# Patient Record
Sex: Female | Born: 1960 | Race: White | Hispanic: No | Marital: Single | Smoking: Never smoker
Health system: Southern US, Community
[De-identification: ages and names within clinical notes are randomized; demographics above are authoritative.]

## PROBLEM LIST (undated history)

## (undated) DIAGNOSIS — I2510 Atherosclerotic heart disease of native coronary artery without angina pectoris: Secondary | ICD-10-CM

## (undated) DIAGNOSIS — K5900 Constipation, unspecified: Secondary | ICD-10-CM

## (undated) DIAGNOSIS — I10 Essential (primary) hypertension: Secondary | ICD-10-CM

## (undated) DIAGNOSIS — F209 Schizophrenia, unspecified: Secondary | ICD-10-CM

## (undated) DIAGNOSIS — E669 Obesity, unspecified: Secondary | ICD-10-CM

## (undated) DIAGNOSIS — R569 Unspecified convulsions: Secondary | ICD-10-CM

## (undated) DIAGNOSIS — F319 Bipolar disorder, unspecified: Secondary | ICD-10-CM

## (undated) DIAGNOSIS — I509 Heart failure, unspecified: Secondary | ICD-10-CM

## (undated) DIAGNOSIS — F0390 Unspecified dementia without behavioral disturbance: Secondary | ICD-10-CM

## (undated) DIAGNOSIS — E119 Type 2 diabetes mellitus without complications: Secondary | ICD-10-CM

---

## 2005-02-04 ENCOUNTER — Ambulatory Visit: Payer: Self-pay

## 2006-07-08 ENCOUNTER — Ambulatory Visit: Payer: Self-pay | Admitting: Family Medicine

## 2007-08-05 ENCOUNTER — Ambulatory Visit: Payer: Self-pay | Admitting: Family Medicine

## 2007-12-10 ENCOUNTER — Ambulatory Visit: Payer: Self-pay | Admitting: Family Medicine

## 2008-05-16 ENCOUNTER — Encounter: Payer: Self-pay | Admitting: Family Medicine

## 2008-05-18 ENCOUNTER — Other Ambulatory Visit: Payer: Self-pay

## 2008-05-18 ENCOUNTER — Emergency Department: Payer: Self-pay | Admitting: Emergency Medicine

## 2008-06-07 ENCOUNTER — Encounter: Payer: Self-pay | Admitting: Family Medicine

## 2008-08-07 ENCOUNTER — Ambulatory Visit: Payer: Self-pay | Admitting: Family Medicine

## 2008-11-27 ENCOUNTER — Emergency Department: Payer: Self-pay | Admitting: Emergency Medicine

## 2008-12-05 ENCOUNTER — Emergency Department: Payer: Self-pay | Admitting: Emergency Medicine

## 2009-04-27 ENCOUNTER — Ambulatory Visit: Payer: Self-pay | Admitting: Family Medicine

## 2009-06-18 ENCOUNTER — Ambulatory Visit: Payer: Self-pay | Admitting: Family Medicine

## 2009-07-03 ENCOUNTER — Encounter: Payer: Self-pay | Admitting: Family Medicine

## 2009-07-08 ENCOUNTER — Encounter: Payer: Self-pay | Admitting: Family Medicine

## 2009-08-08 ENCOUNTER — Encounter: Payer: Self-pay | Admitting: Family Medicine

## 2009-08-14 ENCOUNTER — Ambulatory Visit: Payer: Self-pay | Admitting: Family Medicine

## 2009-08-30 ENCOUNTER — Emergency Department: Payer: Self-pay | Admitting: Emergency Medicine

## 2009-09-14 ENCOUNTER — Encounter: Payer: Self-pay | Admitting: Family Medicine

## 2009-10-08 ENCOUNTER — Encounter: Payer: Self-pay | Admitting: Family Medicine

## 2010-10-22 ENCOUNTER — Ambulatory Visit: Payer: Self-pay | Admitting: Family Medicine

## 2011-10-25 ENCOUNTER — Emergency Department: Payer: Self-pay | Admitting: Emergency Medicine

## 2011-12-06 ENCOUNTER — Emergency Department: Payer: Self-pay | Admitting: Emergency Medicine

## 2012-03-17 ENCOUNTER — Emergency Department: Payer: Self-pay | Admitting: Emergency Medicine

## 2013-07-27 ENCOUNTER — Emergency Department: Payer: Self-pay | Admitting: Internal Medicine

## 2013-07-27 LAB — CBC WITH DIFFERENTIAL/PLATELET
Basophil #: 0.1 10*3/uL (ref 0.0–0.1)
Basophil %: 1.1 %
Eosinophil #: 0.1 10*3/uL (ref 0.0–0.7)
Eosinophil %: 1.3 %
MCHC: 34.2 g/dL (ref 32.0–36.0)
MCV: 96 fL (ref 80–100)
Neutrophil #: 5.7 10*3/uL (ref 1.4–6.5)
RBC: 4.1 10*6/uL (ref 3.80–5.20)
WBC: 8.5 10*3/uL (ref 3.6–11.0)

## 2013-07-27 LAB — COMPREHENSIVE METABOLIC PANEL
Alkaline Phosphatase: 79 U/L (ref 50–136)
BUN: 20 mg/dL — ABNORMAL HIGH (ref 7–18)
Chloride: 104 mmol/L (ref 98–107)
Co2: 21 mmol/L (ref 21–32)
Creatinine: 1.08 mg/dL (ref 0.60–1.30)
EGFR (African American): >60
Glucose: 256 mg/dL — ABNORMAL HIGH (ref 65–99)
Osmolality: 285 (ref 275–301)
Potassium: 4.8 mmol/L (ref 3.5–5.1)
SGOT(AST): 19 U/L (ref 15–37)
SGPT (ALT): 27 U/L (ref 12–78)
Total Protein: 6.4 g/dL (ref 6.4–8.2)

## 2013-07-27 LAB — TROPONIN I: Troponin-I: <0.02 ng/mL

## 2013-08-08 ENCOUNTER — Ambulatory Visit: Payer: Self-pay | Admitting: Internal Medicine

## 2013-08-22 ENCOUNTER — Ambulatory Visit: Payer: Self-pay | Admitting: Family Medicine

## 2013-08-26 ENCOUNTER — Inpatient Hospital Stay: Payer: Self-pay | Admitting: Internal Medicine

## 2013-08-26 LAB — URINALYSIS, COMPLETE
Blood: NEGATIVE
Nitrite: NEGATIVE
Specific Gravity: 1.03 (ref 1.003–1.030)
Squamous Epithelial: 14

## 2013-08-26 LAB — DIFFERENTIAL
Basophil %: 0.6 %
Lymphocyte #: 1.6 10*3/uL (ref 1.0–3.6)
Lymphocyte %: 15.3 %
Monocyte #: 0.9 x10 3/mm (ref 0.2–0.9)
Neutrophil #: 7.7 10*3/uL — ABNORMAL HIGH (ref 1.4–6.5)
Neutrophil %: 74.6 %

## 2013-08-26 LAB — COMPREHENSIVE METABOLIC PANEL
Albumin: 2.7 g/dL — ABNORMAL LOW (ref 3.4–5.0)
Alkaline Phosphatase: 83 U/L (ref 50–136)
Bilirubin,Total: 0.3 mg/dL (ref 0.2–1.0)
Calcium, Total: 8.9 mg/dL (ref 8.5–10.1)
Chloride: 104 mmol/L (ref 98–107)
Co2: 25 mmol/L (ref 21–32)
Creatinine: 0.67 mg/dL (ref 0.60–1.30)
EGFR (African American): >60
Glucose: 221 mg/dL — ABNORMAL HIGH (ref 65–99)
Osmolality: 279 (ref 275–301)
SGPT (ALT): 14 U/L (ref 12–78)

## 2013-08-26 LAB — CBC
HCT: 34.9 % — ABNORMAL LOW (ref 35.0–47.0)
HGB: 11.9 g/dL — ABNORMAL LOW (ref 12.0–16.0)
RBC: 3.66 10*6/uL — ABNORMAL LOW (ref 3.80–5.20)
RDW: 15 % — ABNORMAL HIGH (ref 11.5–14.5)
WBC: 10.3 10*3/uL (ref 3.6–11.0)

## 2013-08-26 LAB — TROPONIN I: Troponin-I: <0.02 ng/mL

## 2013-08-28 LAB — COMPREHENSIVE METABOLIC PANEL
Albumin: 2.2 g/dL — ABNORMAL LOW (ref 3.4–5.0)
Anion Gap: 7 (ref 7–16)
BUN: 12 mg/dL (ref 7–18)
Bilirubin,Total: 0.3 mg/dL (ref 0.2–1.0)
Calcium, Total: 8.8 mg/dL (ref 8.5–10.1)
Chloride: 103 mmol/L (ref 98–107)
Co2: 28 mmol/L (ref 21–32)
Creatinine: 0.8 mg/dL (ref 0.60–1.30)
EGFR (African American): >60
EGFR (Non-African Amer.): >60
Glucose: 192 mg/dL — ABNORMAL HIGH (ref 65–99)
Potassium: 4.2 mmol/L (ref 3.5–5.1)
SGOT(AST): 9 U/L — ABNORMAL LOW (ref 15–37)
Sodium: 138 mmol/L (ref 136–145)
Total Protein: 6.5 g/dL (ref 6.4–8.2)

## 2013-08-28 LAB — CBC WITH DIFFERENTIAL/PLATELET
Basophil #: 0 10*3/uL (ref 0.0–0.1)
Eosinophil %: 0.8 %
HGB: 10.7 g/dL — ABNORMAL LOW (ref 12.0–16.0)
Lymphocyte #: 1.2 10*3/uL (ref 1.0–3.6)
MCHC: 33.8 g/dL (ref 32.0–36.0)
MCV: 95 fL (ref 80–100)
Monocyte #: 0.8 x10 3/mm (ref 0.2–0.9)
Neutrophil #: 7 10*3/uL — ABNORMAL HIGH (ref 1.4–6.5)
Neutrophil %: 77.2 %
Platelet: 286 10*3/uL (ref 150–440)
RDW: 14.8 % — ABNORMAL HIGH (ref 11.5–14.5)
WBC: 9 10*3/uL (ref 3.6–11.0)

## 2013-08-28 LAB — PRO B NATRIURETIC PEPTIDE: B-Type Natriuretic Peptide: 68 pg/mL (ref 0–125)

## 2013-08-29 LAB — BASIC METABOLIC PANEL
BUN: 11 mg/dL (ref 7–18)
Co2: 31 mmol/L (ref 21–32)
Creatinine: 0.67 mg/dL (ref 0.60–1.30)
EGFR (African American): >60
EGFR (Non-African Amer.): >60
Glucose: 102 mg/dL — ABNORMAL HIGH (ref 65–99)
Osmolality: 279 (ref 275–301)
Potassium: 4 mmol/L (ref 3.5–5.1)
Sodium: 140 mmol/L (ref 136–145)

## 2013-08-29 LAB — TROPONIN I: Troponin-I: <0.02 ng/mL

## 2013-08-30 LAB — BASIC METABOLIC PANEL
Anion Gap: 7 (ref 7–16)
BUN: 12 mg/dL (ref 7–18)
Calcium, Total: 8.8 mg/dL (ref 8.5–10.1)
Chloride: 97 mmol/L — ABNORMAL LOW (ref 98–107)
Creatinine: 0.69 mg/dL (ref 0.60–1.30)
Osmolality: 277 (ref 275–301)
Sodium: 133 mmol/L — ABNORMAL LOW (ref 136–145)

## 2013-08-31 LAB — CULTURE, BLOOD (SINGLE)

## 2013-08-31 LAB — VANCOMYCIN, TROUGH: Vancomycin, Trough: 10 ug/mL (ref 10–20)

## 2013-09-02 LAB — DIFFERENTIAL
Basophil %: 0.2 %
Eosinophil #: 0.1 10*3/uL (ref 0.0–0.7)
Eosinophil %: 0.6 %
Lymphocyte #: 1.6 10*3/uL (ref 1.0–3.6)
Monocyte %: 6.3 %
Neutrophil #: 16 10*3/uL — ABNORMAL HIGH (ref 1.4–6.5)
Neutrophil %: 84.2 %

## 2013-09-02 LAB — CREATININE, SERUM: EGFR (African American): >60

## 2013-09-03 LAB — CBC WITH DIFFERENTIAL/PLATELET
Basophil #: 0.1 10*3/uL (ref 0.0–0.1)
Eosinophil %: 0 %
HCT: 28.7 % — ABNORMAL LOW (ref 35.0–47.0)
HGB: 9.6 g/dL — ABNORMAL LOW (ref 12.0–16.0)
Lymphocyte #: 1.3 10*3/uL (ref 1.0–3.6)
Lymphocyte %: 7.1 %
MCH: 31.4 pg (ref 26.0–34.0)
MCV: 94 fL (ref 80–100)
Monocyte #: 0.2 x10 3/mm (ref 0.2–0.9)
Monocyte %: 1.2 %
Neutrophil %: 91.1 %
RDW: 15 % — ABNORMAL HIGH (ref 11.5–14.5)

## 2013-09-03 LAB — VANCOMYCIN, TROUGH: Vancomycin, Trough: 24 ug/mL (ref 10–20)

## 2013-09-03 LAB — BASIC METABOLIC PANEL
Anion Gap: 9 (ref 7–16)
BUN: 24 mg/dL — ABNORMAL HIGH (ref 7–18)
Chloride: 95 mmol/L — ABNORMAL LOW (ref 98–107)
Co2: 27 mmol/L (ref 21–32)
Creatinine: 1.43 mg/dL — ABNORMAL HIGH (ref 0.60–1.30)
Osmolality: 279 (ref 275–301)

## 2013-09-03 LAB — PHOSPHORUS: Phosphorus: 4.5 mg/dL (ref 2.5–4.9)

## 2013-09-04 LAB — BASIC METABOLIC PANEL
Anion Gap: 7 (ref 7–16)
BUN: 28 mg/dL — ABNORMAL HIGH (ref 7–18)
Calcium, Total: 8.7 mg/dL (ref 8.5–10.1)
Chloride: 95 mmol/L — ABNORMAL LOW (ref 98–107)
Co2: 31 mmol/L (ref 21–32)
Creatinine: 1.2 mg/dL (ref 0.60–1.30)
EGFR (African American): >60
Glucose: 302 mg/dL — ABNORMAL HIGH (ref 65–99)
Osmolality: 283 (ref 275–301)
Potassium: 3.7 mmol/L (ref 3.5–5.1)

## 2013-09-04 LAB — PHOSPHORUS: Phosphorus: 3.8 mg/dL (ref 2.5–4.9)

## 2013-09-04 LAB — BRONCHIAL WASH CULTURE

## 2013-09-04 LAB — VANCOMYCIN, RANDOM: Vancomycin, Random: 15 ug/mL

## 2013-09-04 LAB — MAGNESIUM: Magnesium: 2 mg/dL

## 2013-09-05 LAB — CBC WITH DIFFERENTIAL/PLATELET
Basophil %: 0.1 %
Eosinophil #: 0 10*3/uL (ref 0.0–0.7)
Eosinophil %: 0 %
HCT: 33.3 % — ABNORMAL LOW (ref 35.0–47.0)
Lymphocyte #: 1.4 10*3/uL (ref 1.0–3.6)
Lymphocyte %: 6.4 %
MCH: 31.4 pg (ref 26.0–34.0)
MCHC: 34 g/dL (ref 32.0–36.0)
MCV: 93 fL (ref 80–100)
Monocyte %: 2.9 %
Neutrophil #: 19.4 10*3/uL — ABNORMAL HIGH (ref 1.4–6.5)
Neutrophil %: 90.6 %
Platelet: 374 10*3/uL (ref 150–440)
RDW: 14.8 % — ABNORMAL HIGH (ref 11.5–14.5)
WBC: 21.4 10*3/uL — ABNORMAL HIGH (ref 3.6–11.0)

## 2013-09-05 LAB — BASIC METABOLIC PANEL
Anion Gap: 8 (ref 7–16)
Chloride: 98 mmol/L (ref 98–107)
EGFR (African American): 58 — ABNORMAL LOW
Sodium: 137 mmol/L (ref 136–145)

## 2013-09-05 LAB — CLOSTRIDIUM DIFFICILE BY PCR

## 2013-09-05 LAB — VANCOMYCIN, TROUGH: Vancomycin, Trough: 14 ug/mL (ref 10–20)

## 2013-09-06 LAB — CBC WITH DIFFERENTIAL/PLATELET
Basophil %: 0.2 %
Eosinophil #: 0 10*3/uL (ref 0.0–0.7)
HCT: 32.5 % — ABNORMAL LOW (ref 35.0–47.0)
HGB: 10.9 g/dL — ABNORMAL LOW (ref 12.0–16.0)
Lymphocyte #: 1.9 10*3/uL (ref 1.0–3.6)
Lymphocyte %: 9.4 %
MCHC: 33.7 g/dL (ref 32.0–36.0)
MCV: 94 fL (ref 80–100)
Monocyte #: 0.9 x10 3/mm (ref 0.2–0.9)
Monocyte %: 4.2 %
Neutrophil #: 17.6 10*3/uL — ABNORMAL HIGH (ref 1.4–6.5)
Platelet: 381 10*3/uL (ref 150–440)
RDW: 14.8 % — ABNORMAL HIGH (ref 11.5–14.5)

## 2013-09-06 LAB — BASIC METABOLIC PANEL
Anion Gap: 6 — ABNORMAL LOW (ref 7–16)
BUN: 37 mg/dL — ABNORMAL HIGH (ref 7–18)
Calcium, Total: 8.7 mg/dL (ref 8.5–10.1)
Chloride: 101 mmol/L (ref 98–107)
Co2: 32 mmol/L (ref 21–32)
Creatinine: 1.22 mg/dL (ref 0.60–1.30)
EGFR (African American): 59 — ABNORMAL LOW
EGFR (Non-African Amer.): 51 — ABNORMAL LOW
Osmolality: 290 (ref 275–301)
Potassium: 4.2 mmol/L (ref 3.5–5.1)

## 2013-09-07 ENCOUNTER — Ambulatory Visit: Payer: Self-pay | Admitting: Internal Medicine

## 2013-09-07 LAB — MAGNESIUM: Magnesium: 2 mg/dL

## 2013-09-07 LAB — BASIC METABOLIC PANEL
BUN: 40 mg/dL — ABNORMAL HIGH (ref 7–18)
Calcium, Total: 8.7 mg/dL (ref 8.5–10.1)
Co2: 34 mmol/L — ABNORMAL HIGH (ref 21–32)
Creatinine: 1.13 mg/dL (ref 0.60–1.30)
EGFR (African American): >60
EGFR (Non-African Amer.): 56 — ABNORMAL LOW
Glucose: 159 mg/dL — ABNORMAL HIGH (ref 65–99)
Osmolality: 296 (ref 275–301)
Sodium: 142 mmol/L (ref 136–145)

## 2013-09-07 LAB — PHOSPHORUS: Phosphorus: 5.5 mg/dL — ABNORMAL HIGH (ref 2.5–4.9)

## 2013-09-07 LAB — VANCOMYCIN, TROUGH: Vancomycin, Trough: 14 ug/mL (ref 10–20)

## 2013-09-08 LAB — MAGNESIUM: Magnesium: 1.5 mg/dL — ABNORMAL LOW

## 2013-09-08 LAB — BASIC METABOLIC PANEL
EGFR (African American): 54 — ABNORMAL LOW
EGFR (Non-African Amer.): 47 — ABNORMAL LOW
Glucose: 323 mg/dL — ABNORMAL HIGH (ref 65–99)
Osmolality: 305 (ref 275–301)
Sodium: 142 mmol/L (ref 136–145)

## 2013-09-08 LAB — PHOSPHORUS: Phosphorus: 3.4 mg/dL (ref 2.5–4.9)

## 2013-09-09 LAB — BASIC METABOLIC PANEL
BUN: 25 mg/dL — ABNORMAL HIGH (ref 7–18)
Calcium, Total: 8.8 mg/dL (ref 8.5–10.1)
Co2: 35 mmol/L — ABNORMAL HIGH (ref 21–32)
Creatinine: 1.01 mg/dL (ref 0.60–1.30)
EGFR (Non-African Amer.): >60
Glucose: 158 mg/dL — ABNORMAL HIGH (ref 65–99)
Osmolality: 300 (ref 275–301)
Potassium: 3.3 mmol/L — ABNORMAL LOW (ref 3.5–5.1)
Sodium: 147 mmol/L — ABNORMAL HIGH (ref 136–145)

## 2013-09-09 LAB — CBC WITH DIFFERENTIAL/PLATELET
Basophil #: 0.1 10*3/uL (ref 0.0–0.1)
Basophil %: 0.4 %
Eosinophil #: 0.1 10*3/uL (ref 0.0–0.7)
HCT: 29.4 % — ABNORMAL LOW (ref 35.0–47.0)
HGB: 9.8 g/dL — ABNORMAL LOW (ref 12.0–16.0)
Lymphocyte %: 17 %
MCH: 31.2 pg (ref 26.0–34.0)
MCV: 94 fL (ref 80–100)
Monocyte %: 7.6 %
Platelet: 333 10*3/uL (ref 150–440)
RDW: 14.9 % — ABNORMAL HIGH (ref 11.5–14.5)
WBC: 14.4 10*3/uL — ABNORMAL HIGH (ref 3.6–11.0)

## 2013-09-09 LAB — PHOSPHORUS: Phosphorus: 3 mg/dL (ref 2.5–4.9)

## 2013-09-09 LAB — EXPECTORATED SPUTUM ASSESSMENT W GRAM STAIN, RFLX TO RESP C

## 2013-09-09 LAB — VALPROIC ACID LEVEL: Valproic Acid: 10 ug/mL — ABNORMAL LOW

## 2013-09-09 LAB — POTASSIUM: Potassium: 4.3 mmol/L (ref 3.5–5.1)

## 2013-09-10 LAB — BASIC METABOLIC PANEL
Anion Gap: 5 — ABNORMAL LOW (ref 7–16)
BUN: 17 mg/dL (ref 7–18)
Chloride: 113 mmol/L — ABNORMAL HIGH (ref 98–107)
EGFR (Non-African Amer.): >60
Glucose: 166 mg/dL — ABNORMAL HIGH (ref 65–99)
Osmolality: 298 (ref 275–301)

## 2013-09-11 LAB — CBC WITH DIFFERENTIAL/PLATELET
Basophil #: 0 10*3/uL (ref 0.0–0.1)
Basophil %: 0.2 %
Eosinophil #: 0.3 10*3/uL (ref 0.0–0.7)
Eosinophil %: 2.3 %
MCH: 32.4 pg (ref 26.0–34.0)
MCHC: 34.4 g/dL (ref 32.0–36.0)
MCV: 94 fL (ref 80–100)
Monocyte #: 0.9 x10 3/mm (ref 0.2–0.9)
Monocyte %: 6.5 %
Neutrophil #: 10.5 10*3/uL — ABNORMAL HIGH (ref 1.4–6.5)
Neutrophil %: 78.2 %
Platelet: 324 10*3/uL (ref 150–440)
RDW: 15 % — ABNORMAL HIGH (ref 11.5–14.5)
WBC: 13.5 10*3/uL — ABNORMAL HIGH (ref 3.6–11.0)

## 2013-09-12 LAB — VALPROIC ACID LEVEL: Valproic Acid: 42 ug/mL — ABNORMAL LOW

## 2013-09-13 LAB — DIFFERENTIAL
Basophil #: 0 10*3/uL (ref 0.0–0.1)
Eosinophil %: 3 %
Lymphocyte #: 2 10*3/uL (ref 1.0–3.6)
Neutrophil #: 6.6 10*3/uL — ABNORMAL HIGH (ref 1.4–6.5)
Neutrophil %: 68.9 %

## 2013-09-13 LAB — WBC: WBC: 9.6 10*3/uL (ref 3.6–11.0)

## 2013-09-28 ENCOUNTER — Emergency Department: Payer: Self-pay | Admitting: Emergency Medicine

## 2013-09-28 LAB — URINALYSIS, COMPLETE
Bilirubin,UR: NEGATIVE
Glucose,UR: NEGATIVE mg/dL (ref 0–75)
Hyaline Cast: 22
Nitrite: NEGATIVE
Ph: 5 (ref 4.5–8.0)
Protein: 100
Specific Gravity: 1.015 (ref 1.003–1.030)
Squamous Epithelial: NONE SEEN
WBC UR: 2160 /HPF (ref 0–5)

## 2013-09-28 LAB — CBC WITH DIFFERENTIAL/PLATELET
Basophil %: 0.7 %
HCT: 34 % — ABNORMAL LOW (ref 35.0–47.0)
HGB: 11.4 g/dL — ABNORMAL LOW (ref 12.0–16.0)
MCV: 97 fL (ref 80–100)
Monocyte #: 0.5 x10 3/mm (ref 0.2–0.9)
Monocyte %: 7.1 %
Neutrophil #: 4 10*3/uL (ref 1.4–6.5)
Platelet: 502 10*3/uL — ABNORMAL HIGH (ref 150–440)
RBC: 3.52 10*6/uL — ABNORMAL LOW (ref 3.80–5.20)
WBC: 6.9 10*3/uL (ref 3.6–11.0)

## 2013-09-28 LAB — COMPREHENSIVE METABOLIC PANEL
Albumin: 3.1 g/dL — ABNORMAL LOW (ref 3.4–5.0)
Anion Gap: 16 (ref 7–16)
BUN: 14 mg/dL (ref 7–18)
Bilirubin,Total: 0.2 mg/dL (ref 0.2–1.0)
Calcium, Total: 9.1 mg/dL (ref 8.5–10.1)
Chloride: 102 mmol/L (ref 98–107)
Co2: 18 mmol/L — ABNORMAL LOW (ref 21–32)
EGFR (African American): >60
Glucose: 149 mg/dL — ABNORMAL HIGH (ref 65–99)
Osmolality: 275 (ref 275–301)
Potassium: 4.5 mmol/L (ref 3.5–5.1)
Sodium: 136 mmol/L (ref 136–145)
Total Protein: 6.7 g/dL (ref 6.4–8.2)

## 2013-09-28 LAB — DRUG SCREEN, URINE
Benzodiazepine, Ur Scrn: NEGATIVE (ref ?–200)
Cocaine Metabolite,Ur ~~LOC~~: NEGATIVE (ref ?–300)
MDMA (Ecstasy)Ur Screen: NEGATIVE (ref ?–500)
Methadone, Ur Screen: NEGATIVE (ref ?–300)
Phencyclidine (PCP) Ur S: NEGATIVE (ref ?–25)
Tricyclic, Ur Screen: NEGATIVE (ref ?–1000)

## 2013-10-08 ENCOUNTER — Ambulatory Visit: Payer: Self-pay | Admitting: Nurse Practitioner

## 2014-06-28 ENCOUNTER — Ambulatory Visit: Payer: Self-pay | Admitting: Obstetrics & Gynecology

## 2014-06-28 LAB — CBC
HCT: 35.4 % (ref 35.0–47.0)
HGB: 11.3 g/dL — ABNORMAL LOW (ref 12.0–16.0)
MCH: 30.3 pg (ref 26.0–34.0)
MCHC: 31.8 g/dL — ABNORMAL LOW (ref 32.0–36.0)
MCV: 95 fL (ref 80–100)
Platelet: 308 10*3/uL (ref 150–440)
RBC: 3.72 10*6/uL — ABNORMAL LOW (ref 3.80–5.20)
RDW: 14.9 % — ABNORMAL HIGH (ref 11.5–14.5)
WBC: 8.4 10*3/uL (ref 3.6–11.0)

## 2014-06-28 LAB — BASIC METABOLIC PANEL
ANION GAP: 8 (ref 7–16)
BUN: 15 mg/dL (ref 7–18)
CALCIUM: 8.7 mg/dL (ref 8.5–10.1)
Chloride: 105 mmol/L (ref 98–107)
Co2: 26 mmol/L (ref 21–32)
Creatinine: 0.93 mg/dL (ref 0.60–1.30)
GLUCOSE: 210 mg/dL — AB (ref 65–99)
Osmolality: 285 (ref 275–301)
Potassium: 4.7 mmol/L (ref 3.5–5.1)
Sodium: 139 mmol/L (ref 136–145)

## 2014-07-04 LAB — CBC WITH DIFFERENTIAL/PLATELET
Basophil #: 0 10*3/uL (ref 0.0–0.1)
Basophil %: 0.6 %
Eosinophil #: 0.1 10*3/uL (ref 0.0–0.7)
Eosinophil %: 0.8 %
HCT: 33.8 % — ABNORMAL LOW (ref 35.0–47.0)
HGB: 10.9 g/dL — AB (ref 12.0–16.0)
LYMPHS PCT: 28.6 %
Lymphocyte #: 2.3 10*3/uL (ref 1.0–3.6)
MCH: 30.7 pg (ref 26.0–34.0)
MCHC: 32.3 g/dL (ref 32.0–36.0)
MCV: 95 fL (ref 80–100)
Monocyte #: 0.5 x10 3/mm (ref 0.2–0.9)
Monocyte %: 5.9 %
Neutrophil #: 5.2 10*3/uL (ref 1.4–6.5)
Neutrophil %: 64.1 %
Platelet: 299 10*3/uL (ref 150–440)
RBC: 3.56 10*6/uL — AB (ref 3.80–5.20)
RDW: 15 % — ABNORMAL HIGH (ref 11.5–14.5)
WBC: 8.1 10*3/uL (ref 3.6–11.0)

## 2014-07-04 LAB — BASIC METABOLIC PANEL
Anion Gap: 8 (ref 7–16)
BUN: 8 mg/dL (ref 7–18)
Calcium, Total: 8.2 mg/dL — ABNORMAL LOW (ref 8.5–10.1)
Chloride: 105 mmol/L (ref 98–107)
Co2: 28 mmol/L (ref 21–32)
Creatinine: 0.98 mg/dL (ref 0.60–1.30)
EGFR (African American): >60
GLUCOSE: 219 mg/dL — AB (ref 65–99)
Osmolality: 286 (ref 275–301)
POTASSIUM: 4.3 mmol/L (ref 3.5–5.1)
Sodium: 141 mmol/L (ref 136–145)

## 2014-07-04 LAB — DIFFERENTIAL
BASOS PCT: 0.6 %
Basophil #: 0 10*3/uL (ref 0.0–0.1)
Eosinophil #: 0.1 10*3/uL (ref 0.0–0.7)
Eosinophil %: 0.8 %
Lymphocyte #: 2.3 10*3/uL (ref 1.0–3.6)
Lymphocyte %: 28.6 %
Monocyte #: 0.5 x10 3/mm (ref 0.2–0.9)
Monocyte %: 5.9 %
Neutrophil #: 5.2 10*3/uL (ref 1.4–6.5)
Neutrophil %: 64.1 %

## 2014-07-05 ENCOUNTER — Inpatient Hospital Stay: Payer: Self-pay | Admitting: Internal Medicine

## 2014-07-05 DIAGNOSIS — I509 Heart failure, unspecified: Secondary | ICD-10-CM

## 2014-07-05 DIAGNOSIS — R0989 Other specified symptoms and signs involving the circulatory and respiratory systems: Secondary | ICD-10-CM

## 2014-07-05 DIAGNOSIS — I10 Essential (primary) hypertension: Secondary | ICD-10-CM

## 2014-07-05 DIAGNOSIS — R0609 Other forms of dyspnea: Secondary | ICD-10-CM

## 2014-07-05 DIAGNOSIS — I5021 Acute systolic (congestive) heart failure: Secondary | ICD-10-CM

## 2014-07-05 DIAGNOSIS — I359 Nonrheumatic aortic valve disorder, unspecified: Secondary | ICD-10-CM

## 2014-07-05 LAB — BASIC METABOLIC PANEL
Anion Gap: 7 (ref 7–16)
BUN: 16 mg/dL (ref 7–18)
CALCIUM: 8 mg/dL — AB (ref 8.5–10.1)
CO2: 28 mmol/L (ref 21–32)
Chloride: 106 mmol/L (ref 98–107)
Creatinine: 1.21 mg/dL (ref 0.60–1.30)
EGFR (African American): 59 — ABNORMAL LOW
GFR CALC NON AF AMER: 51 — AB
Glucose: 137 mg/dL — ABNORMAL HIGH (ref 65–99)
OSMOLALITY: 285 (ref 275–301)
Potassium: 4.4 mmol/L (ref 3.5–5.1)
Sodium: 141 mmol/L (ref 136–145)

## 2014-07-05 LAB — CBC WITH DIFFERENTIAL/PLATELET
BASOS ABS: 0.1 10*3/uL (ref 0.0–0.1)
BASOS PCT: 0.9 %
Eosinophil #: 0.1 10*3/uL (ref 0.0–0.7)
Eosinophil %: 1.8 %
HCT: 32.9 % — AB (ref 35.0–47.0)
HGB: 10.9 g/dL — ABNORMAL LOW (ref 12.0–16.0)
LYMPHS ABS: 2.7 10*3/uL (ref 1.0–3.6)
Lymphocyte %: 37.5 %
MCH: 31.4 pg (ref 26.0–34.0)
MCHC: 33.3 g/dL (ref 32.0–36.0)
MCV: 95 fL (ref 80–100)
MONOS PCT: 6.4 %
Monocyte #: 0.5 x10 3/mm (ref 0.2–0.9)
NEUTROS ABS: 3.8 10*3/uL (ref 1.4–6.5)
NEUTROS PCT: 53.4 %
Platelet: 289 10*3/uL (ref 150–440)
RBC: 3.48 10*6/uL — AB (ref 3.80–5.20)
RDW: 15.6 % — ABNORMAL HIGH (ref 11.5–14.5)
WBC: 7.1 10*3/uL (ref 3.6–11.0)

## 2014-07-06 LAB — BASIC METABOLIC PANEL
ANION GAP: 7 (ref 7–16)
BUN: 15 mg/dL (ref 7–18)
CO2: 30 mmol/L (ref 21–32)
Calcium, Total: 8.6 mg/dL (ref 8.5–10.1)
Chloride: 103 mmol/L (ref 98–107)
Creatinine: 1.22 mg/dL (ref 0.60–1.30)
EGFR (African American): 59 — ABNORMAL LOW
EGFR (Non-African Amer.): 51 — ABNORMAL LOW
GLUCOSE: 140 mg/dL — AB (ref 65–99)
Osmolality: 283 (ref 275–301)
Potassium: 4.4 mmol/L (ref 3.5–5.1)
SODIUM: 140 mmol/L (ref 136–145)

## 2014-07-06 LAB — PATHOLOGY REPORT

## 2014-07-09 ENCOUNTER — Emergency Department: Payer: Self-pay | Admitting: Emergency Medicine

## 2014-07-09 LAB — CBC WITH DIFFERENTIAL/PLATELET
BASOS ABS: 0 10*3/uL (ref 0.0–0.1)
BASOS PCT: 0.5 %
EOS ABS: 0.1 10*3/uL (ref 0.0–0.7)
EOS PCT: 1.2 %
HCT: 40.1 % (ref 35.0–47.0)
HGB: 13.2 g/dL (ref 12.0–16.0)
LYMPHS ABS: 2.5 10*3/uL (ref 1.0–3.6)
LYMPHS PCT: 27.3 %
MCH: 30.8 pg (ref 26.0–34.0)
MCHC: 32.9 g/dL (ref 32.0–36.0)
MCV: 93 fL (ref 80–100)
Monocyte #: 0.6 x10 3/mm (ref 0.2–0.9)
Monocyte %: 6.2 %
NEUTROS PCT: 64.8 %
Neutrophil #: 5.8 10*3/uL (ref 1.4–6.5)
PLATELETS: 285 10*3/uL (ref 150–440)
RBC: 4.3 10*6/uL (ref 3.80–5.20)
RDW: 15 % — AB (ref 11.5–14.5)
WBC: 9 10*3/uL (ref 3.6–11.0)

## 2014-07-09 LAB — TROPONIN I

## 2014-07-09 LAB — BASIC METABOLIC PANEL
Anion Gap: 10 (ref 7–16)
BUN: 20 mg/dL — AB (ref 7–18)
CHLORIDE: 103 mmol/L (ref 98–107)
CO2: 23 mmol/L (ref 21–32)
CREATININE: 1.07 mg/dL (ref 0.60–1.30)
Calcium, Total: 8.6 mg/dL (ref 8.5–10.1)
EGFR (African American): >60
GFR CALC NON AF AMER: 59 — AB
Glucose: 120 mg/dL — ABNORMAL HIGH (ref 65–99)
Osmolality: 276 (ref 275–301)
Potassium: 4.1 mmol/L (ref 3.5–5.1)
Sodium: 136 mmol/L (ref 136–145)

## 2014-07-10 LAB — URINALYSIS, COMPLETE
BILIRUBIN, UR: NEGATIVE
BLOOD: NEGATIVE
Bacteria: NONE SEEN
GLUCOSE, UR: NEGATIVE mg/dL (ref 0–75)
Leukocyte Esterase: NEGATIVE
Nitrite: NEGATIVE
Ph: 5 (ref 4.5–8.0)
RBC,UR: <1 /HPF (ref 0–5)
Specific Gravity: 1.015 (ref 1.003–1.030)
Squamous Epithelial: 1
WBC UR: 1 /HPF (ref 0–5)

## 2014-09-24 ENCOUNTER — Emergency Department: Payer: Self-pay | Admitting: Emergency Medicine

## 2014-09-24 LAB — COMPREHENSIVE METABOLIC PANEL
ALBUMIN: 3.2 g/dL — AB (ref 3.4–5.0)
ALK PHOS: 83 U/L
ALT: 21 U/L
Anion Gap: 13 (ref 7–16)
BUN: 12 mg/dL (ref 7–18)
Bilirubin,Total: 0.3 mg/dL (ref 0.2–1.0)
CHLORIDE: 108 mmol/L — AB (ref 98–107)
Calcium, Total: 8.4 mg/dL — ABNORMAL LOW (ref 8.5–10.1)
Co2: 24 mmol/L (ref 21–32)
Creatinine: 0.91 mg/dL (ref 0.60–1.30)
EGFR (Non-African Amer.): >60
Glucose: 88 mg/dL (ref 65–99)
OSMOLALITY: 288 (ref 275–301)
Potassium: 3.8 mmol/L (ref 3.5–5.1)
SGOT(AST): 12 U/L — ABNORMAL LOW (ref 15–37)
Sodium: 145 mmol/L (ref 136–145)
Total Protein: 6.4 g/dL (ref 6.4–8.2)

## 2014-09-24 LAB — CBC
HCT: 36.2 % (ref 35.0–47.0)
HGB: 11.9 g/dL — ABNORMAL LOW (ref 12.0–16.0)
MCH: 30.5 pg (ref 26.0–34.0)
MCHC: 32.8 g/dL (ref 32.0–36.0)
MCV: 93 fL (ref 80–100)
Platelet: 252 10*3/uL (ref 150–440)
RBC: 3.88 10*6/uL (ref 3.80–5.20)
RDW: 17.1 % — ABNORMAL HIGH (ref 11.5–14.5)
WBC: 7.9 10*3/uL (ref 3.6–11.0)

## 2014-09-24 LAB — VALPROIC ACID LEVEL: VALPROIC ACID: 29 ug/mL — AB

## 2014-09-24 LAB — ETHANOL: Ethanol: <3 mg/dL (ref 0–80)

## 2014-09-24 LAB — PRO B NATRIURETIC PEPTIDE: B-TYPE NATIURETIC PEPTID: 86 pg/mL (ref 0–125)

## 2014-12-15 ENCOUNTER — Observation Stay: Payer: Self-pay | Admitting: Internal Medicine

## 2014-12-15 LAB — BASIC METABOLIC PANEL
Anion Gap: 15 (ref 7–16)
BUN: 18 mg/dL (ref 7–18)
CHLORIDE: 101 mmol/L (ref 98–107)
CO2: 24 mmol/L (ref 21–32)
Calcium, Total: 8.8 mg/dL (ref 8.5–10.1)
Creatinine: 1.67 mg/dL — ABNORMAL HIGH (ref 0.60–1.30)
EGFR (African American): 41 — ABNORMAL LOW
EGFR (Non-African Amer.): 34 — ABNORMAL LOW
Glucose: 128 mg/dL — ABNORMAL HIGH (ref 65–99)
OSMOLALITY: 283 (ref 275–301)
Potassium: 4 mmol/L (ref 3.5–5.1)
Sodium: 140 mmol/L (ref 136–145)

## 2014-12-15 LAB — VALPROIC ACID LEVEL: Valproic Acid: 34 ug/mL — ABNORMAL LOW

## 2014-12-15 LAB — URINALYSIS, COMPLETE
BILIRUBIN, UR: NEGATIVE
BLOOD: NEGATIVE
Bacteria: NONE SEEN
Glucose,UR: NEGATIVE mg/dL (ref 0–75)
Leukocyte Esterase: NEGATIVE
Nitrite: NEGATIVE
PH: 5 (ref 4.5–8.0)
Protein: NEGATIVE
RBC,UR: <1 /HPF (ref 0–5)
SQUAMOUS EPITHELIAL: NONE SEEN
Specific Gravity: 1.014 (ref 1.003–1.030)
WBC UR: 4 /HPF (ref 0–5)

## 2014-12-15 LAB — CBC WITH DIFFERENTIAL/PLATELET
Basophil #: 0 10*3/uL (ref 0.0–0.1)
Basophil %: 0.4 %
EOS PCT: 0.7 %
Eosinophil #: 0.1 10*3/uL (ref 0.0–0.7)
HCT: 40.5 % (ref 35.0–47.0)
HGB: 12.8 g/dL (ref 12.0–16.0)
LYMPHS PCT: 28.7 %
Lymphocyte #: 2.5 10*3/uL (ref 1.0–3.6)
MCH: 30.2 pg (ref 26.0–34.0)
MCHC: 31.7 g/dL — ABNORMAL LOW (ref 32.0–36.0)
MCV: 95 fL (ref 80–100)
Monocyte #: 0.5 x10 3/mm (ref 0.2–0.9)
Monocyte %: 5.8 %
NEUTROS PCT: 64.4 %
Neutrophil #: 5.6 10*3/uL (ref 1.4–6.5)
Platelet: 262 10*3/uL (ref 150–440)
RBC: 4.24 10*6/uL (ref 3.80–5.20)
RDW: 15.9 % — ABNORMAL HIGH (ref 11.5–14.5)
WBC: 8.7 10*3/uL (ref 3.6–11.0)

## 2014-12-16 LAB — CBC WITH DIFFERENTIAL/PLATELET
Basophil #: 0 10*3/uL (ref 0.0–0.1)
Basophil %: 0.4 %
Eosinophil #: 0.1 10*3/uL (ref 0.0–0.7)
Eosinophil %: 1.1 %
HCT: 34.7 % — AB (ref 35.0–47.0)
HGB: 11.4 g/dL — ABNORMAL LOW (ref 12.0–16.0)
LYMPHS ABS: 2.5 10*3/uL (ref 1.0–3.6)
Lymphocyte %: 28.2 %
MCH: 30.8 pg (ref 26.0–34.0)
MCHC: 32.7 g/dL (ref 32.0–36.0)
MCV: 94 fL (ref 80–100)
MONO ABS: 0.7 x10 3/mm (ref 0.2–0.9)
Monocyte %: 7.5 %
Neutrophil #: 5.6 10*3/uL (ref 1.4–6.5)
Neutrophil %: 62.8 %
Platelet: 242 10*3/uL (ref 150–440)
RBC: 3.69 10*6/uL — ABNORMAL LOW (ref 3.80–5.20)
RDW: 16.2 % — AB (ref 11.5–14.5)
WBC: 8.9 10*3/uL (ref 3.6–11.0)

## 2014-12-16 LAB — BASIC METABOLIC PANEL
ANION GAP: 7 (ref 7–16)
BUN: 12 mg/dL (ref 7–18)
CO2: 30 mmol/L (ref 21–32)
Calcium, Total: 8.2 mg/dL — ABNORMAL LOW (ref 8.5–10.1)
Chloride: 107 mmol/L (ref 98–107)
Creatinine: 1.13 mg/dL (ref 0.60–1.30)
EGFR (African American): >60
EGFR (Non-African Amer.): 54 — ABNORMAL LOW
GLUCOSE: 42 mg/dL — AB (ref 65–99)
Osmolality: 283 (ref 275–301)
Potassium: 4.3 mmol/L (ref 3.5–5.1)
SODIUM: 144 mmol/L (ref 136–145)

## 2014-12-16 LAB — MAGNESIUM: Magnesium: 1.9 mg/dL

## 2015-03-19 ENCOUNTER — Emergency Department
Admit: 2015-03-19 | Disposition: A | Discharge status: Home / Self Care | Payer: Self-pay | Admitting: Emergency Medicine

## 2015-03-19 LAB — COMPREHENSIVE METABOLIC PANEL
AST: 29 U/L
Albumin: 3.5 g/dL
Alkaline Phosphatase: 60 U/L
Anion Gap: 17 — ABNORMAL HIGH (ref 7–16)
BUN: 21 mg/dL — AB
Bilirubin,Total: 0.2 mg/dL — ABNORMAL LOW
CO2: 24 mmol/L
Calcium, Total: 9.1 mg/dL
Chloride: 104 mmol/L
Creatinine: 1.28 mg/dL — ABNORMAL HIGH
EGFR (African American): 55 — ABNORMAL LOW
GFR CALC NON AF AMER: 47 — AB
GLUCOSE: 69 mg/dL
POTASSIUM: 3.9 mmol/L
SGPT (ALT): 14 U/L
SODIUM: 145 mmol/L
Total Protein: 6.3 g/dL — ABNORMAL LOW

## 2015-03-19 LAB — CBC
HCT: 38.6 % (ref 35.0–47.0)
HGB: 12.3 g/dL (ref 12.0–16.0)
MCH: 31.1 pg (ref 26.0–34.0)
MCHC: 31.8 g/dL — ABNORMAL LOW (ref 32.0–36.0)
MCV: 98 fL (ref 80–100)
Platelet: 241 10*3/uL (ref 150–440)
RBC: 3.95 10*6/uL (ref 3.80–5.20)
RDW: 16.1 % — ABNORMAL HIGH (ref 11.5–14.5)
WBC: 8.1 10*3/uL (ref 3.6–11.0)

## 2015-03-19 LAB — VALPROIC ACID LEVEL: VALPROIC ACID: 70 ug/mL (ref 50–100)

## 2015-03-30 NOTE — Consult Note (Signed)
   Comments   Follow up visit made. Pt still responsive with no acute complaints. However she still has SaO2 85% on 100% NRB. Plan is to transfer her STAT to CCU. Called and updated her sister. She would be ok with intubation if needed and understands that this may be a possibility. Will follow.   Electronic Signatures: Borders, Daryl EasternJoshua R (NP)  (Signed 26-Sep-14 09:52)  Authored: Palliative Care Phifer, Harriett SineNancy (MD)  (Signed 26-Sep-14 10:26)  Authored: Palliative Care   Last Updated: 26-Sep-14 10:26 by Phifer, Harriett SineNancy (MD)

## 2015-03-30 NOTE — Consult Note (Signed)
   Comments   Called and spoke with pt's sister. She confirms that pt is a DNR and says "I don't think her quality of life would be good if we had to put her back on machines". She confirms family would not want reintubation in the event of pt doing poorly after extubation. However, she wanted to ensure that we would keep her comfortable regardless the outcome. All questions answered.   Electronic Signatures: Denette Hass, Daryl EasternJoshua R (NP)  (Signed 01-Oct-14 10:41)  Authored: Palliative Care Phifer, Harriett SineNancy (MD)  (Signed 01-Oct-14 21:26)  Authored: Palliative Care   Last Updated: 01-Oct-14 21:26 by Phifer, Harriett SineNancy (MD)

## 2015-03-30 NOTE — Consult Note (Signed)
Psychiatry: Follow up eval. Patient is mentally better today than yesterday. Able to say her name and make a guess at where she is. Able to say she lives in [place] and able [place] lift hands to my request. Valproic acid level <10, which is interesting as she was getting the syrup formula all along. Clozapine level pending.  suspect her mental state is more "organic" than catatonia though again that is hard to tell. I would not yet change dose of anything. Recheck depakote level in 2 days, I will continue to follow.  Electronic Signatures: Audery Amellapacs, John T (MD)  (Signed on 04-Oct-14 14:54)  Authored  Last Updated: 04-Oct-14 14:54 by Audery Amellapacs, John T (MD)
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
## 2015-03-30 NOTE — Consult Note (Signed)
Brief Consult Note: Diagnosis: dementia or delirium vs catatonia.   Patient was seen by consultant.   Consult note dictated.   Recommend further assessment or treatment.   Orders entered.   Comments: Psychiatry: PAtient seen and chart reviewed. PAtient very impaired now. Could not hold a conversation or reliably even answer a simple question. Attention very poor. Can't reliably follow commands. Hard to know if this is "organic" or mental health as patient has chronic severe mental health problems. Her past psych hx not really known except she was on clozapine and had already been dx with dementia recently.  I ordered a depakote level and clozapine level to r/o toxicity from those. If this persists if would be reasonable to do an MRI or at least a CT to look for specific brain lesions or at least degree of atrophy. She has one from 2012 to compare it to that was basically normal. Would not change treatment acutely. Will follow.  Electronic Signatures: Audery Amellapacs, Aijalon Kirtz T (MD)  (Signed 03-Oct-14 12:34)  Authored: Brief Consult Note   Last Updated: 03-Oct-14 12:34 by Audery Amellapacs, Margarita Bobrowski T (MD)

## 2015-03-30 NOTE — Consult Note (Signed)
PATIENT NAME:  Candice Sawyer, Candice Sawyer MR#:  [id number] DATE OF BIRTH:  05/18/1961  DATE OF CONSULTATION:  09/09/2013  REFERRING PHYSICIAN:   CONSULTING PHYSICIAN:  Audery Amel, MD  IDENTIFYING INFORMATION AND REASON FOR CONSULTATION: A 54 year old woman with a history of schizophrenia and multiple medical problems who has been in the Critical Care Unit for the past couple weeks, part of that time intubated recovering from a pneumonia. Consultation "the patient looks catatonic."   HISTORY OF PRESENT ILLNESS: Information obtained primarily from the chart, only to a small degree from the patient as she is not able to give much in the way of information. The patient presented to the hospital on September 19th with a respiratory infection. Despite treatment her condition worsened and she has been in the CCU since the 22nd. She has been on a ventilator for the last several days. Was just extubated yesterday I believe. Since being extubated staff have noticed that the patient has a clearly abnormal mental status. She is not responding appropriately, not speaking, does not seem to be making any effort to take care of herself. Unclear what is causing this current mental status problem.  On my evaluation with the patient, I found her to be apparently awake, but only intermittently responsive. On some occasions she would respond to her name, on others she would not. She was not able to follow commands reliably and was not able to vocalize or respond to questions reliably. She was not able to offer any history at all. The patient has been on clozapine and Depakote and those medicines have been continued throughout her hospitalization, including when she was on the ventilator.   PAST PSYCHIATRIC HISTORY: The patient is known to be on clozapine. She has also been on Depakote. As far as I can tell, we do not see any record of psychiatric hospitalization at our facility. She was evaluated in 2012, in the Emergency Room, by  the psychiatric intake nurse, but was not admitted to psychiatry. I am not sure whether she has a guardian or not. At least at the end of 2012 she was seeing Dr. Marland KitchenDictation Anomaly) <<MISSING TEXCunninghamT>>. I am not sure who has been continuing to prescribe her medicines lately. We have a stated record that she had had hospitalizations at [facility] in the past. Details of that are all unknown. Unknown whether she has any history of suicidal or aggressive behavior.   PAST MEDICAL HISTORY: The patient has been on a ventilator for the last several days recovering from an acute respiratory infection and failure. She also has past history of gastric reflux, type 2 diabetes, coronary artery disease, hypertension, dyslipidemia, a history of a seizure disorder supposedly. Bipolar disorder or schizophrenia had been diagnosed with dementia in the not to distant past.   SOCIAL HISTORY: Lives at a group home. It was not listed that she had family involved. I am not sure whether she has a guardian or not.   SUBSTANCE ABUSE HISTORY: No information about it, neither with the family history.  REVIEW OF SYSTEMS: The patient could not give any information, except when I did ask if she was hurting she seemed to grunt something that seemed to be a no.   MENTAL STATUS EXAMINATION: The patient was interviewed in the CCU. She had her eyes open. When I spoke to her initially she made a grunting vocalization and seemed to turn towards me. She seemed to pay attention to me for a brief period  of time, but then her eyes left me and she no longer paid attention. When I tried to draw her attention by saying her name several more times she would only intermittently respond. I asked her to raise one of her hands and she was able to do it and was able to squeeze my fingers with both hands when I put my fingers in it. On another occasion, she was not able to follow direct commands. She was not able to tell me her  name or repeat a word. She was not able to reliably follow my finger with her eyes when I moved it in front of her. When I did leave the room, however, she made a vocalization that might have been a goodbye.   ASSESSMENT: This is a 54 year old woman currently with a severely altered mental status. Differential diagnosis could include catatonia but could also include diffuse brain injury. Also, possibly toxicity from her medicines.   TREATMENT PLAN: I have ordered blood levels of her clozapine and Depakote just to make sure we do not have toxicity from those. Otherwise, medical condition is being taken care of in the CCU. She has a history of having a CT scan a couple of years ago that was read as essentially normal. If this mental status persists, it would probably be reasonable to get another CT or even possibly a MRI to see if there is any discrete brain lesion or to see if there is any remarkable change in degree of atrophy. I see this note about a chronic seizure disorder. I do not know any other details about that. Right now she does not look to me very much like somebody who would be in a status epilepticus, but it might not be a bad idea to get an EEG on her again if this condition persists. Until seeing the levels of her medicine, I do not have any other treatment recommendations. I will continue to follow. It might be good if we could get any past psychiatric records or outpatient records of her treatment if possible.   DIAGNOSIS, PRINCIPAL AND PRIMARY:  AXIS I: Delirium of unclear cause, multiple medical etiologies.   SECONDARY DIAGNOSES: AXIS I:  1.  Dementia, also probably multiple medical etiologies.  2.  Schizophrenia by history.  AXIS II: No diagnosis.  AXIS III: Recently intubated, recovering from respiratory infection, diabetes, high blood pressure, gastric reflux.  AXIS IV: Unknown, severe acutely at least from being in the CCU. AXIS V: Functioning at time of evaluation  10. ____________________________ Audery AmelJohn T. Adama Ferber, MD jtc:sb D: 09/09/2013 13:13:18 ET T: 09/09/2013 13:33:26 ET JOB#: [id number]  cc: Audery AmelJohn T. Caroll Cunnington, MD, <Dictator> Audery AmelJOHN T Wilfred Dayrit MD ELECTRONICALLY SIGNED 09/09/2013 15:26

## 2015-03-30 NOTE — H&P (Signed)
PATIENT NAME:  Candice Sawyer, [name] MR#:  [id number] DATE OF BIRTH:  07/22/1961  DATE OF ADMISSION:  08/26/2013  PRIMARY CARE PHYSICIAN: Nzingha J. White  CHIEF COMPLAINT: Cough for 1 week. The patient was found to be hypoxic in the Emergency Room.   HISTORY OF PRESENT ILLNESS: Candice Sawyer is a 54 year old obese Caucasian female who is a resident from a group home, has history of chronic schizophrenia, memory loss/dementia, bipolar disorder, history of seizure disorder, diabetes, who comes in with productive cough and low-grade fever along with shortness of breath. The patient was found to be hypoxic with 87 on room air. She has very thick course crackly cough. She is a nonsmoker. Denies any history of pneumonia in the past. Chest x-ray in the Emergency Room showed right lower lobe pneumonia. She also has some shortness of breath, and her sats dropped into the lower 80s on 2 liters of oxygen per ER physician. She was found to be tachycardiac along with tachypnea and chest x-ray consistent with pneumonia. She is being admitted with SIRS due to right lower lobe pneumonia.   PAST MEDICAL HISTORY:  1. GERD.  2. Type 2 diabetes.  3. CAD.  4. Hypertension.  5. Hyperlipidemia.  6. Chronic seizure disorder.  7. Bipolar disorder.  8. Schizophrenia.  9. Memory loss.  10. History of some gait disturbance.   ALLERGIES: HALDOL, STELAZINE, THORAZINE AND TRAZODONE.   HOME MEDICATIONS:  1. Amlodipine 5 mg daily.  2. Aspirin 81 mg daily.  3. Atorvastatin 40 mg at bedtime.  4. Azelastine nasal spray 1 spray twice a day.  5. Benztropine 2 mg daily.  6. Clonazepam 0.5 mg 1/2-tablet at bedtime.  7. Clozapine 100 mg 1/2-tablet in the evening at 5:00 p.m. and 4-1/2 tablets daily at bedtime.  8. Divalproex sodium 500 mg delayed release 2 tablets at bedtime.  9. Donepezil 23 mg daily.  10. Lantus 21 units at bedtime.  11. Loratadine 10 mg daily.  12. Metformin 1000 mg b.i.d.  13. Mucinex 600 mg b.i.d.  14.  NovoLog aspart 10 units t.i.d. with meals.  15. Omeprazole 20 mg b.i.d.  16. Paxil 40 mg daily.   SOCIAL HISTORY: The patient is a nonsmoker, lives at a group home. Denies any history of alcohol use.   FAMILY HISTORY: Positive for diabetes in mother and father.   REVIEW OF SYSTEMS:  CONSTITUTIONAL: No fever. Positive for fatigue, weakness and shortness of breath.  EYES: No blurred or double vision, cataracts or glaucoma.  ENT: No tinnitus, ear pain, hearing loss.  RESPIRATORY: Positive for cough, shortness of breath. Negative for COPD or TB.  CARDIOVASCULAR: No chest pain, orthopnea, edema or arrhythmia. Positive for dyspnea on exertion.  GASTROINTESTINAL: No nausea, vomiting, diarrhea, abdominal pain. No hematemesis. Positive for GERD.  GENITOURINARY: No dysuria, hematuria or frequency.  ENDOCRINE: No polyuria, nocturia or thyroid problems.  HEMATOLOGY: No anemia or easy bruising.  SKIN: No acne or rash.  MUSCULOSKELETAL: Positive for arthritis. NEUROLOGIC: No CVA, TIA, dysarthria.  PSYCHIATRIC: Positive for bipolar disorder and schizophrenia. No anxiety or depression. All other systems reviewed and negative.   PHYSICAL EXAMINATION:  GENERAL: The patient is awake, alert, oriented x3, not in acute distress.  VITAL SIGNS: She is afebrile, pulse is 101, respirations 24, blood pressure 121/65, saturation is 87% on room air, 91% on 2 liters.  HEENT: Atraumatic, normocephalic. PERRLA. EOM intact. Oral mucosa is moist.  NECK: Supple. No JVD. No carotid bruit.  RESPIRATORY: Decreased breath sounds. Coarse breath sounds  bilaterally. Some coarse crackles heard in bilateral bases, more prominent on the right. No use of accessory muscles.  CARDIOVASCULAR: Both the heart sounds are normal. Rate and rhythm regular. PMI not lateralized.  CHEST: Nontender.  EXTREMITIES: Good pedal pulses, good femoral pulses. No lower extremity edema.  ABDOMEN: Obese, soft, nontender. No organomegaly. Positive bowel  sounds.  NEUROLOGIC: Grossly intact cranial nerves II through XII. No motor or sensory deficit.  PSYCHIATRIC: The patient is awake, alert, oriented x3.  SKIN: Warm and dry.   DIAGNOSTIC STUDIES: EKG shows sinus tachycardia. Chest x-ray consistent with poor inspiration, right lung base infiltrate. Troponin is less than 0.02. White count is 10.3, H and H are 11.9 and 34.9, platelet count is 262. Glucose 221. The rest of the comprehensive panel is within normal limits.   ASSESSMENT: The 54 year old Candice Sawyer with history of type 2 diabetes, hypertension, CAD, schizophrenia and bipolar disorder comes in from group home with increasing shortness of breath with thick phlegm. She is being admitted with:   1. Systemic inflammatory response syndrome due to right lower lobe pneumonia. Will admit the patient to the medical floor. Start the patient on IV Levaquin. Will also continue DuoNebs around the clock every 6 hours. The patient does not have any history of smoking or any history of chronic obstructive pulmonary disease or asthma, and she is not wheezing, so will hold off on Solu-Medrol for now. Follow up sputum cultures and blood cultures.  2. Hypoxemia secondary to right lower lobe pneumonia. On room air, saturation was 87%, improved to 91% to 92% on 2 liters nasal cannula oxygen. Will continue to monitor and consider pulse oximetry on room air once the patient's breathing improves.  3. Schizophrenia. Continue the patient's clonazepam, clozapine and benztropine.  4. Type 2 diabetes. The patient is on Lantus and insulin aspart 3 times a day. Will place her on sliding scale. Will continue metformin 1000 b.i.d. as well.  5. History of coronary artery disease. Continue statins and aspirin.  6. Hypertension. Will resume amlodipine.  7. Gastroesophageal reflux disease. Continue PPI.  8. Deep vein thrombosis prophylaxis with subcutaneous Lovenox daily.   Further workup will depend on the patient's clinical  course. Hospital admission plan was discussed with the patient. No family members present.   CODE STATUS: The patient is a full code.   TIME SPENT: 55 minutes.   ____________________________ Wylie Hail Allena Katz, MD sap:OSi D: 08/26/2013 11:10:28 ET T: 08/26/2013 11:33:38 ET JOB#: [id number]  cc: Antonino Nienhuis A. Allena Katz, MD, <Dictator> Corliss Marcus. [facility] - [place] [facility] Willow Ora MD ELECTRONICALLY SIGNED 08/26/2013 15:03

## 2015-03-30 NOTE — Discharge Summary (Signed)
PATIENT NAME:  Candice Sawyer, Candice Sawyer DATE OF BIRTH:  [birth year]  DATE OF ADMISSION:  08/26/2013 DATE OF DISCHARGE:  09/13/2013   Please see interim discharge summary dictated on the 26th of September by Dr. Winona LegatoVaickute for course from admission until the 26th of September. Please also see interim discharge summary dictated by Dr. Cherlynn KaiserSainani on 3rd of October for course from the 26th of September until 3rd of October. This is a final discharge summary.  FINAL DISCHARGE DIAGNOSES:  1. Acute respiratory failure, likely due to pneumonia, now on 2 liters oxygen.  2. Bilateral pneumonia, Aspiration Variety - present on admission and treated. 3. Systemic inflammatory response syndrome due to pneumonia.   SECONDARY DIAGNOSES:  4. Gastroesophageal reflux disease.  5. Diabetes.  6. Coronary artery disease. 7. Hypertension. 8. Hyperlipidemia.  9. Chronic seizure disorder.  10. Bipolar disorder.  11. Schizophrenia. 12. Memory loss. 13. History of gait disturbance.   CONSULTATIONS: As dictated by Dr. Winona LegatoVaickute and Dr. Cherlynn KaiserSainani in the interim discharge summary. No new consultation was obtained after 3rd of October.   PROCEDURES AND RADIOLOGY: As dictated in the interim discharge summary dictated by Dr. Winona LegatoVaickute and Dr. Cherlynn KaiserSainani from admission until 3rd of October. No subsequent procedures and radiology were done.   HISTORY AND SHORT HOSPITAL COURSE: The patient is a 54 year old female with the above-mentioned medical problems, who was admitted for SIRS thought to be secondary to pneumonia. The patient was hypoxic on admission. Please see Dr. Jearl KlinefelterSona Patel's dictated history and physical for further details. Please see Dr. Arlys JohnVaickute's dictated interim discharge summary on the 26th of September for detailed course from admission until 26th of September. Please also note Dr. Hilbert OdorSainani's interim discharge summary dictated on 3rd of October for course from 26th of September until 3rd of October. Subsequently,  psychiatry was consulted for evaluation of dementia versus delirium, and Dr. Toni Amendlapacs recommended checking Depakote level along with clozapine level to make sure of no toxicity. He did not recommend changing the dose of the medication, and the patient was slowly improving. She was close to her baseline. She was DNR, as per discussion with palliative care, and the patient was in agreement. She is now on 3 liters oxygen and has been feeling significantly better. Is being discharged to skilled nursing facility in stable condition for further rehab.   PERTINENT PHYSICAL EXAMINATION ON THE DATE OF DISCHARGE:  VITAL SIGNS: On the date of discharge, temperature 98, heart rate 88 per minute, respirations 20 per minute, blood pressure 117/81 mmHg. She is saturating 93% on 2 liters oxygen via nasal cannula.  CARDIOVASCULAR: S1, S2 normal. No murmurs, rubs or gallop.  LUNGS: Clear to auscultation bilaterally. No wheezing, rales, rhonchi or crepitation.  ABDOMEN: Soft, benign.  NEUROLOGIC: Nonfocal examination.  All other physical examination remained at baseline.   DISCHARGE MEDICATIONS:  1. Clonazepam 0.5 mg 1/2-tablet p.o. at bedtime.  2. Azelastine 1 spray intranasally twice a day.  3. Amlodipine 5 mg p.o. daily.  4. Lipitor 40 mg p.o. at bedtime.  5. Aspirin 81 mg p.o. daily.  6. Metformin 1000 mg p.o. b.i.d.  7. Donepezil once daily.  8. Loratadine 10 mg p.o. daily.  9. Clozapine 50 mg p.o. every evening at 5:00 p.m. and 450 mg p.o. at bedtime at 7:00 p.m.  10. Benztropine 2 mg p.o. daily.  11. Divalproex sodium 500 mg 2 tablets p.o. at bedtime.  12. Paroxetine 40 mg p.o. at bedtime.  13. Omeprazole 20 mg p.o. b.i.d. 14. Insulin  Lantus 21 units subcutaneous at bedtime. 15. Mucinex 600 mg p.o. b.i.d. as needed.  16. Insulin NovoLog 10 units subcutaneous 3 times a day.   DISCHARGE DIET: Low sodium, low fat, low cholesterol, 1800 ADA.   DISCHARGE ACTIVITY: As tolerated.   DISCHARGE INSTRUCTIONS  AND FOLLOWUP: The patient was instructed to follow up with her primary care physician, Dr. Magdalene River, in 1 to 2 weeks. She will need followup with [facility], Dr. Max Fickle, in 2 to 4 weeks.   TOTAL TIME DISCHARGING THIS PATIENT: 55 minutes.  ____________________________ Ellamae Sia. Sherryll Burger, MD vss:OSi D: 09/13/2013 13:33:59 ET T: 09/13/2013 13:53:05 ET JOB#: [id number]  cc: Jannice Beitzel S. Sherryll Burger, MD, <Dictator> Corliss Marcus. [facility] - [place] [facility] Lupita Leash, MD Audery Amel, MD Ned Grace, MD Ellamae Sia [facility] MD ELECTRONICALLY SIGNED 09/16/2013 13:02

## 2015-03-31 NOTE — Consult Note (Signed)
General Aspect 54 y/o female w/o prior cardiac hx whom we've been asked to evaluate 2/2 hypoxia and possible CHF.  PAST MEDICAL HISTORY:  1.  Prolonged hospitalization secondary to pneumonia requiring intubation in September to October 2014.   2.  Diabetes mellitus, insulin-dependent.  3.  Seizure disorder.  4.  Hypertension.  5.  Bipolar disorder.  6.  Schizophrenia.  7.  Dementia.  8.  Post-menopausal bleeding s/p hysteroscopy and D&C 7/28. 9. H/O bilat pna and SIRS Sept/Oct 2014.  PAST SURGICAL HISTORY: Endometrial polyp removal done today.  ALLERGIES TO MEDICATIONS: HALDOL, STELAZINE, THORAZINE, TRAZODONE.   Present Illness 54 y/o female with the above problem list. Prior H & P reports h/o CAD however pt denies this.  She has never had a stress test or cath that she is aware of.  She has been having post-menopausal bleeding and yesterday underwent hysteroscopy, D & C.  Post-operatively she was noted to be hypoxic and was subsequently admitted.  CXR showed left basilar opacity suggestive of subsegmental ATX vs pna.  Labs notable for mild anemia.  She was placed on levaquin and IV lasix as she had previously been on lasix @ home however it was held preoperatively.  She has been afebrile with stable HR/BP and O2 sats in the high 80's to mid 90's on 2lpm of O2.  She cont to feel dyspneic.  Echo was performed today and showed nl EF with Grade 2 diast dysfxn and Ao sclerosis.  Of note, she had been on lasix as an outpt but says that it was stopped about 5 days prior to admission. She is not sure why.  Labs from 7/15 showed nl BUN/Creat.  She denies chest pain, pnd, dizziness, syncope, or edema.  She is currently orthopneic.  She does not know what her dry wt is however she was listed @ 203 lbs on 7/20 office visit with OBGYN and was 212.9 yesterday.   Physical Exam:  GEN pleasant, nad.   HEENT pink conjunctivae, moist oral mucosa   NECK supple  No masses   RESP diminished breath sounds  right base, otw cta.   CARD Regular rate and rhythm  Normal, S1, S2  No murmur   ABD denies tenderness  no hernia  normal BS   LYMPH negative neck   EXTR negative cyanosis/clubbing, negative edema   SKIN No rashes   NEURO grossly intact, nonfocal.   PSYCH alert, A+O to time, place, person   Review of Systems:  General: No Complaints   Skin: No Complaints   ENT: No Complaints   Eyes: No Complaints   Neck: No Complaints   Respiratory: No Complaints   Cardiovascular: Dyspnea  Orthopnea   Gastrointestinal: No Complaints   Genitourinary: No Complaints   Vascular: No Complaints   Musculoskeletal: No Complaints   Neurologic: No Complaints   Hematologic: No Complaints   Endocrine: No Complaints   Psychiatric: No Complaints   Review of Systems: All other systems were reviewed and found to be negative   Medications/Allergies Reviewed Medications/Allergies reviewed   Family & Social History:  Family and Social History:  Family History FAMILY HISTORY: Significant for heart disease in the family.   Social History SOCIAL HISTORY: Lives in group home. Has a 24-hour caregiver. Sister also lives locally and is involved in her care. No smoking or alcohol use.   Place of Living group home.   Home Medications: Medication Instructions Status  NovoLOG Mix 70/30 30 units-70 units/mL subcutaneous suspension 25 unit(s)  subcutaneous once a day Active  Amitiza 24 mcg oral capsule 1 cap(s) orally once a day Active  omeprazole 20 mg oral delayed release capsule 1 cap(s) orally 2 times a day Active  Tylenol 500 mg oral tablet 1 tab(s) orally every 4 hours, As Needed Active  guaiFENesin 100 mg/5 mL oral liquid 5 milliliter(s) orally every 4 hours, As Needed Active  Milk of Magnesia 1 dose(s) orally once a day, As Needed Active  doc q lax 1 tab(s) orally 2 times a day, As Needed Active  Depakote ER 500 mg oral tablet, extended release 2 tab(s) orally once a day (at bedtime)  Active  donepezil 23 mg oral tablet 1 tab(s) orally once a day (at bedtime) Active  Paxil 40 mg oral tablet 1 tab(s) orally once a day Active  amLODIPine 5 mg oral tablet 1 tab(s) orally once a day for HTN (0900) Active  atorvastatin 40 mg oral tablet 1 tab(s) orally once a day (at bedtime) for hyperlipidemia (2100) Active  Aspirin Enteric Coated 81 mg oral delayed release tablet 1 tab(s) orally once a day for circulation (0900) Active  metFORMIN 1000 mg oral tablet 1 tab(s) orally 2 times a day for DM (0900, 1700) Active  cloZAPine 100 mg oral tablet 0.5 tab(s) orally once a day (in the evening) and 4.5 tablets daily at bedtime (5 pm, 7 pm) Active  benztropine 2 mg oral tablet 1 tab(s) orally once a day for behavior (0800) Active  divalproex sodium 500 mg oral delayed release tablet 2 tab(s) orally once a day (at bedtime) for mood/seizures (2100) Active  clonazePAM 0.5 mg oral tablet 1 tab(s) orally once a day (at bedtime) for bipolar disorder and schizophrenia (0800) Active   Lab Results:  LabObservation:  29-Jul-15 13:08   OBSERVATION Reason for Test  Routine Chem:  29-Jul-15 05:03   Glucose, Serum  137  BUN 16  Creatinine (comp) 1.21  Sodium, Serum 141  Potassium, Serum 4.4  CO2, Serum 28  Calcium (Total), Serum  8.0  Anion Gap 7  Osmolality (calc) 285  eGFR (African American)  59  eGFR (Non-African American)  51 (eGFR values <74m/min/1.73 m2 may be an indication of chronic kidney disease (CKD). Calculated eGFR is useful in patients with stable renal function. The eGFR calculation will not be reliable in acutely ill patients when serum creatinine is changing rapidly. It is not useful in  patients on dialysis. The eGFR calculation may not be applicable to patients at the low and high extremes of body sizes, pregnant women, and vegetarians.)  Routine Hem:  29-Jul-15 05:03   WBC (CBC) 7.1  RBC (CBC)  3.48  Hemoglobin (CBC)  10.9  Hematocrit (CBC)  32.9  Platelet Count  (CBC) 289  MCV 95  MCH 31.4  MCHC 33.3  RDW  15.6  Neutrophil % 53.4  Lymphocyte % 37.5  Monocyte % 6.4  Eosinophil % 1.8  Basophil % 0.9  Neutrophil # 3.8  Lymphocyte # 2.7  Monocyte # 0.5  Eosinophil # 0.1  Basophil # 0.1 (Result(s) reported on 05 Jul 2014 at 05:25AM.)   EKG:  EKG Interp. by me   Interpretation rsr, 89, no acute st/t changes.   Radiology Results: XRay:    28-Jul-15 12:08, Chest 1 View AP or PA  Chest 1 View AP or PA   REASON FOR EXAM:    low O2 sat  COMMENTS:       PROCEDURE: DXR - DXR CHEST 1 VIEWAP OR PA  -  Jul 04 2014 12:08PM     CLINICAL DATA:  Hypoxia.    EXAM:  CHEST - 1 VIEW    COMPARISON:  September 02, 2013.    FINDINGS:  Cardiomediastinal silhouette is within normal limits. Hypoinflation  of the lungs is noted. Right lung is clear. No pneumothorax or  pleural effusion is noted. Left basilar opacity is noted consistent  with subsegmental atelectasis or possibly pneumonia. Bony thorax is  intact.     IMPRESSION:  Left basilar opacity is noted consistent with subsegmental  atelectasis or possibly pneumonia. Followup radiographs are  recommended until resolution.      Electronically Signed    By: Sabino Dick M.D.    On: 07/04/2014 12:26       Verified By: Marveen Reeks, M.D.,    Haloperidol: Anaphylaxis  Stelazine: Anaphylaxis  Thorazine: Unknown  Trazodone: Unknown  Vital Signs/Nurse's Notes: **Vital Signs.:   29-Jul-15 15:50  Vital Signs Type Q 4hr  Temperature Temperature (F) 98.5  Celsius 36.9  Temperature Source oral  Pulse Pulse 84  Respirations Respirations 18  Systolic BP Systolic BP 505  Diastolic BP (mmHg) Diastolic BP (mmHg) 77  Mean BP 91  Pulse Ox % Pulse Ox % 91  Pulse Ox Activity Level  At rest  Oxygen Delivery 2L    Impression 1.  Dyspnea and Hypoxia:  Following hysteroscopy and D & C yesterday, pt remained hypoxic and dyspneic.  CXR showed subsegmental atx with left basilar opacity, ? pna.  She  has been placed on abx.  She is afebrile and WBCs have been nl.  She continues to feel dyspneic with sats in the high 80's to mid 90's on 2lpm.  She was previously on lasix @ home and this was apparently d/c'd about 5 days prior to admission.  Echo showed nl LV fxn with Gr 2 diast dysfxn.  She does not have obvious volume overload on exam however wt, if accurate, is 9 lbs above her preadmission wt (203 on 7/20 in OBGYN office).  Agree with resumption of lasix. Will increase to 65m IV BID for the time being and we can likely transition this back to oral lasix, 466mdaily, tomorow morning.  Encourage incentive spirometer.  Cont abx per IM.  2.  Acute diast CHF:  No prior h/o CHF though she reports a h/o lower ext edema, for which she had been on lasix @ home.  As above, this was stopped about a week ago for unknown reasons.  Cont IV lasix tonight and plan to transition to PO lasix tomorrow.  HR/BP stable.  3. HTN:  Stable.   4.  DM:  Per IM.   Electronic Signatures for Addendum Section:  HaLeonie ManMD) (Signed Addendum 29-Jul-15 21:33)  I have seen & examined the patinet this PM along Mr. BeSharolyn DouglasNP-C.  I agree with his findings, examination as well as impression recommendations. We discussed the plan noted above. I reviewed her echocardiogram today. We are asked to consult for postop hypoxia concerning for possible CHF. 3 suggest atelectasis with possible basal consolidation. She's been given low-dose IV Lasix and has diuresed nicely. Her dyspnea seems to be overall improved and is now on nasal cannula. Her echocardiogram does suggest great to diastolic dysfunction and it is possible that the perioperative intravenous fluids, she is voiding overloaded and would diastolic dysfunction has developed diastolic heart failure. Her Lasix was actually stopped preoperativelyfor an unknown reason the apparently her weight seems to be up about  5 pounds over dry weight and this is probably most suggestive of  postoperative volume overload I don't think based on discussion with her that she actually has true diastolic heart failure at baseline but simply has the tendency to diastolic heart failure due to diastolic dysfunction.  Her blood pressure is well-controlled on amlodipine. Simply sluggish higher dose of Lasix for short horn arm, and then an sure that her  home dose of Lasix dose is restarted prior to discharge.  We will continue to follow. I do not think that she necessarily needs cardiology followup as her PCP to monitor her volume status. Her EF was preserved   Electronic Signatures: Rogelia Mire (NP)  (Signed 29-Jul-15 18:06)  Authored: General Aspect/Present Illness, History and Physical Exam, Review of System, Family & Social History, Home Medications, Labs, EKG , Radiology, Allergies, Vital Signs/Nurse's Notes, Impression/Plan Leonie Man (MD)  (Signed 29-Jul-15 21:33)  Co-Signer: General Aspect/Present Illness, Family & Social History, Home Medications, Allergies   Last Updated: 29-Jul-15 21:33 by Leonie Man (MD)

## 2015-03-31 NOTE — H&P (Signed)
PATIENT NAME:  Candice Sawyer, Candice Sawyer MR#:  [id number] DATE OF BIRTH:  16-Jul-1961  DATE OF ADMISSION:  07/04/2014  ADMITTING PHYSICIAN: Enid Baasadhika Jermell Holeman, MD  PRIMARY CARE PHYSICIAN: Toy CookeyErnest Eason, MD  CHIEF COMPLAINT: Hypoxia.   HISTORY OF PRESENT ILLNESS: Candice Sawyer is a 54 year old Caucasian female with past medical history significant for dementia, bipolar disorder, schizophrenia and mental retardation, hypertension, insulin-dependent diabetes mellitus, coronary artery disease, and seizure disorder who had a prolonged hospitalization from September to October 2014 for respiratory failure requiring intubation secondary to aspiration pneumonia. Was discharged to rehab on 3 liters oxygen at the time. The patient apparently did well, has been at home with a caregiver, then has been off of oxygen for months now. She has been having some post vaginal bleeding and so was scheduled for hysteroscopy and D and C and had a polyp removed today. Postoperatively the patient appears fine, but she has been hypoxic in the 80s when her oxygen was removed. Right now she is requiring 1 to 2 liters of oxygen, sats being fine, and chest x-ray showing possible pneumonia. She is being admitted under observation for hypoxia postop.   PAST MEDICAL HISTORY:  1.  Prolonged hospitalization secondary to pneumonia requiring intubation in September to October 2014.   2.  Coronary artery disease.  3.  Diabetes mellitus, insulin-dependent.  4.  Seizure disorder.  5.  Hypertension.  6.  Bipolar disorder.  7.  Schizophrenia.  8.  Dementia.   PAST SURGICAL HISTORY: Endometrial polyp removal done today.  ALLERGIES TO MEDICATIONS: HALDOL, STELAZINE, THORAZINE, TRAZODONE.   CURRENT HOME MEDICATIONS:  1.  Amitiza 24 mcg p.o. daily.  2.  Amlodipine 5 mg p.o. daily.  3.  Aspirin 81 mg p.o. daily, which has been held for the past week for her procedure today.  4.  Atorvastatin 40 mg p.o. daily.  5.  Benztropine 2 mg p.o. daily.  6.   Clonazepam 0.5 mg at bedtime.  7.  Clozapine 50 mg p.o. in the evening and also at bedtime.  8.  Depakote 1000 mg p.o. daily.  9.  Colace 1 tablet p.o. b.i.d. p.r.n.  10.  Donepezil 23 mg p.o. daily.  11.  Guaifenesin 5 mL q. 4 hours p.r.n. for cough.  12.  Metformin 1000 mg p.o. b.i.d.  13.  Milk of magnesia p.r.n. for constipation.  14.  NovoLog 70/30 insulin 25 units subcutaneously once a day.  15.  Prilosec 20 mg p.o. b.i.d.  16.  Paxil 40 mg p.o. daily.  17.  Tylenol 500 mg q. 4 hours p.r.n. for pain or fever.   SOCIAL HISTORY: Lives at home. Has a 24-hour caregiver. Sister also lives locally and is involved in her care. No smoking or alcohol use.   FAMILY HISTORY: Significant for heart disease in the family.   REVIEW OF SYSTEMS: CONSTITUTIONAL: No fever, fatigue or weakness.  EYES: No blurred vision, double vision, inflammation or glaucoma.  ENT: No tinnitus, ear pain, hearing loss, epistaxis or discharge.  RESPIRATORY: No cough, wheeze, hemoptysis or COPD. CARDIOVASCULAR: No chest pain, orthopnea, edema, arrhythmia, palpitations, or syncope.  GASTROINTESTINAL: No nausea, vomiting, diarrhea, abdominal pain, hematemesis, or melena.  GENITOURINARY: No dysuria, hematuria, renal calculus, frequency, or incontinence.  ENDOCRINE: No polyuria, nocturia, thyroid problems, heat or cold intolerance. HEMATOLOGIC: No anemia, easy bruising or bleeding. SKIN: No acne, rash or lesions. MUSCULOSKELETAL: No neck, back or shoulder pain. No arthritis or gout. NEUROLOGIC: No numbness, weakness, CVA, TIA or seizures.  PSYCHOLOGIC: No anxiety, insomnia  or depression.   PHYSICAL EXAMINATION: VITAL SIGNS: Temperature afebrile, pulse 75, blood pressure 118/62, pulse ox 92% on 2 liters oxygen, respiratory rate 16.  GENERAL: Well-built, well-nourished female sitting in bed, not in any acute distress.  HEENT: Normocephalic, atraumatic. Pupils equal, round and reacting to light. Anicteric sclerae.  Extraocular movements intact. Oropharynx clear without erythema, mass or exudates.  NECK: Supple. No thyromegaly, JVD or carotid bruits. No lymphadenopathy.  LUNGS: Moving air bilaterally. Decreased bibasilar breath sounds. Mild fine rhonchi heard in both bases. No wheeze or crackles. No use of accessory muscles for breathing.  HEART: S1 and S2 regular rate and rhythm. No murmurs, rubs, or gallops.  ABDOMEN: Soft, nontender, nondistended. No hepatosplenomegaly. Normal bowel sounds.  EXTREMITIES: No pedal edema. No clubbing or cyanosis. 2+ dorsalis pedis pulses palpable bilaterally.  SKIN: No acne, rash or lesions.  LYMPHATICS: No cervical lymphadenopathy.  NEUROLOGIC: Cranial nerves intact. No focal motor or sensory deficits.  PSYCHOLOGICAL: The patient is awake, alert and oriented x2.  DIAGNOSTIC DATA: Chest x-ray done now postoperatively showing left basilar opacity consistent with subsegmental atelectasis or pneumonia. Followup radiographs to ensure clearing recommended.   EKG: Normal sinus rhythm.   ASSESSMENT AND PLAN: This is a 54 year old female with multiple medical problems including hypertension, diabetes, coronary artery disease, bipolar schizophrenia and dementia admitted from home, had a hysteroscopy procedure and was hypoxic with postoperatively. 1.  Postoperative hypoxia, likely from atelectasis, also received sedative medication. Chest x-ray showing maybe pneumonia, so will admit to phase III on 2 liters oxygen. Will continue to wean to room. No fevers. Incentive spirometry and started on Levaquin with her infiltrates. She has previous admission for pneumonia requiring intubation last year so continue to monitor carefully.  2.  Seizure disorder. Continue her Depakote. 3.  Bipolar, schizophrenia, and dementia.  Continue home medications.  4.  Hypertension. She is on Norvasc.  5.  Diabetes mellitus. Continue 70/30 insulin. Metformin has been held for the procedure. 6.   Postmenopausal bleeding status post hysteroscopy. Management per OB/GYN. recommend outpatient follow-up.  7.  Deep vein thrombosis prophylaxis with subcutaneous heparin.   CODE STATUS: FULL code.   TIME SPENT ON ADMISSION: 50 minutes.   ____________________________ Enid Baas, MD rk:sb D: 07/04/2014 14:01:52 ET T: 07/04/2014 14:41:37 ET JOB#: [id number]  cc: Enid Baas, MD, <Dictator> Serita Sheller. Maryellen Pile, MD Enid Baas MD ELECTRONICALLY SIGNED 07/05/2014 15:11

## 2015-03-31 NOTE — Op Note (Signed)
PATIENT NAME:  Candice Sawyer, [name] MR#:  [id number] DATE OF BIRTH:  11-02-1961  DATE OF PROCEDURE:  07/04/2014  PREOPERATIVE DIAGNOSIS: Postmenopausal bleeding.   POSTOPERATIVE DIAGNOSIS: Postmenopausal bleeding.   PROCEDURE: Hysteroscopy, dilation and curettage.  SURGEON: Dierdre Searles. Paul Jaliah Foody, M.D.   ANESTHESIA: General.   ESTIMATED BLOOD LOSS: Minimal.   COMPLICATIONS: None.   FINDINGS: Polyp/proliferative tissue seen and removed.  DISPOSITION: To recovery room in stable condition.   TECHNIQUE: The patient is prepped and draped in the usual sterile fashion after adequate anesthesia is obtained in the dorsal lithotomy position. The bladder is drained with a Robinson catheter. Speculum is placed and the anterior lip of the cervix was grasped with tenaculum. An endocervical curettage with a Kevorkian curette is then obtained. The cervix is then dilated to a size 7 Hegar dilator. A 30 degree hysteroscope is inserted with lactated Ringer fluid used to distend the intrauterine cavity with a 30 mL discrepancy of fluid determined at the end of the case. The above-mentioned findings were visualized. The scope was removed and a curettage is performed with polypectomy forceps and banjo curette. Tissue is obtained for sampling. Repeat insertion of the hysteroscope revealed still some tissue remaining. Using a MyoSure resection device, this tissue is excised without difficulty or complication and without perforation. Excellent hemostasis is noted at the site of removal of this tissue. Fluid is removed and the scope is also removed. Tenaculum is removed with excellent hemostasis noted at the cervical site. The patient goes to the recovery room in stable condition having tolerated the procedure well. Sponge, instrument and needle counts are correct.     ____________________________ R. Annamarie MajorPaul Ariannah Arenson, MD rph:sb D: 07/04/2014 09:54:37 ET T: 07/04/2014 10:14:21 ET JOB#: [id number]  cc: Dierdre Searles. Paul Lunabella Badgett, MD,  <Dictator> Nadara MustardOBERT P Lucita Montoya MD ELECTRONICALLY SIGNED 07/04/2014 14:47

## 2015-03-31 NOTE — Consult Note (Signed)
PATIENT NAME:  Candice Sawyer, Candice Sawyer MR#:  [id number] DATE OF BIRTH:  [birth year]  DATE OF CONSULTATION:  07/05/2014  CONSULTING PHYSICIAN:  Marjean DonnaEileen A. Delynn Olvera, PA-C  INDICATION FOR CONSULTATION: Hypoxia, possible congestive heart failure.   HISTORY OF PRESENT ILLNESS: This is a 54 year old pleasant white female with past medical history of CAD, hypertension, diabetes mellitus, schizophrenia and dementia. The patient had hysteroscopy with D and C today for postmenopausal bleeding. Postoperatively, the patient was found to be hypoxic with saturations in the 80s when the oxygen was removed. This is why cardiology was consulted. The patient denies chest pain or shortness of breath. She does complain of bilateral pedal edema and pain for a couple of weeks.   PAST SURGICAL HISTORY: Includes endometrial polyp removal that was completed today.   ALLERGIES TO MEDICATIONS: INCLUDE HALDOL, STELAZINE, THORAZINE AND TRAZODONE.  CURRENT HOME MEDICATIONS: Include: 1. Amitiza 24 mcg p.o. daily.  2. Amlodipine 5 mg p.o. daily.  3. Aspirin 81 mg daily. 4. Atorvastatin 40 mg daily. 5. Benztropine 2 mg daily.  6. Clonazepam 0.5 mg at bedtime.  7. Clozapine 50 mg p.o. in the evening and also at bedtime. 8. Depakote 1000 mg daily. 9. Colace 1 tablet b.i.d. p.r.n.  10. Donepezil 23 mg p.o. daily. 11. Guaifenesin 5 mL q.4 hours p.r.n. cough. 12. Metformin 1000 mg p.o. b.i.d. 13. Milk of Magnesia p.r.n. constipation. 14. NovoLog 70/30 insulin 25 units subcutaneous once a day. 15. Prilosec 20 mg b.i.d. 16. Paxil 40 mg daily. 17. Tylenol 500 mg q.4 p.r.n. pain or fever.   FAMILY HISTORY: Not significant for any heart disease.  PHYSICAL EXAMINATION: GENERAL: The patient is in no acute distress. She does have nasal cannula oxygen intact.  HEENT: Reveals no JVD.  LUNGS: Clear to auscultation. No wheezes.  HEART: Reveals normal S1, S2. No murmurs.  ABDOMEN: Soft, nontender. Positive bowel sounds.  EXTREMITIES: Show  trace pedal edema bilaterally.  VITAL SIGNS: Include a temperature of 97.7, pulse of 77, respirations 18, blood pressure 100/70, 94 saturation at 2 liters oxygen.   DIAGNOSTIC DATA: EKG shows normal sinus rhythm, 86 beats per minute. No acute changes.   ASSESSMENT AND PLAN: Hypoxia, could be related to congestive heart failure. Agree with diuresis with Lasix 20 mg q.12 and agree with getting echo. Will continue to follow.   Thank you very much for this consultation.   ____________________________ Alinda SierrasEileen A. Margarito CourserRomano, PA-C ear:lb D: 07/05/2014 13:27:21 ET T: 07/05/2014 14:11:58 ET JOB#: [id number]  cc: Marjean DonnaEileen A. Margarito Courseromano, PA-C, <Dictator> Alinda SierrasEILEEN A Kadarious Dikes PA ELECTRONICALLY SIGNED 08/04/2014 17:06

## 2015-03-31 NOTE — Discharge Summary (Signed)
PATIENT NAME:  [name], Sekai J MR#:  [id number] DATE OF BIRTH:  03/01/1961  DATE OF ADMISSION:  07/05/2014 DATE OF DISCHARGE:  07/06/2014  DISCHARGE DIAGNOSES:  Acute on chronic respiratory failure, likely due to congestive heart failure exacerbation with underlying atelectasis.   SECONDARY DIAGNOSES:  1. Coronary artery disease.  2. Diabetes mellitus.  3. Seizure disorder.  4. Hypertension.  5. Bipolar disorder. 6. Schizophrenia.  7. Dementia.   CONSULTATIONS: Cardiology, Dr. Adrian BlackwaterShaukat Khan.   PROCEDURES AND RADIOLOGY: Chest x-ray on 28th of July showed atelectasis in the base.   A 2D echocardiogram on the 29th of July showed LVEF of 55 to 60%. Elevated mean left atrial pressure. Mildly increased LV septal thickness. Mild to moderate aortic valve sclerosis.   MAJOR LABORATORY PANEL: None.   HISTORY AND SHORT HOSPITAL COURSE: The patient is a 54 year old female with the above-mentioned medical problems who was admitted for hypoxia. Please see Dr. Prudencio PairKalisetti's  dictated history and physical for further details. This was thought to be likely postoperative sedative and anesthetic effect.  Subsequently she was found to be somewhat swollen and getting more history of holding Lasix a lot longer before her scheduled surgery, which likely lead her into acute on chronic diastolic heart failure. A cardiology consultation was obtained with Dr. Adrian BlackwaterShaukat Khan who recommended 2D echo, which was performed with results dictated above. The patient was evaluated by physical therapy and did not recommend any skilled needs. The patient was feeling much better and was close to her baseline and was discharged home in stable condition. She was saturating 92% on room air and did not require any oxygenation.   PHYSICAL EXAMINATION:  On the date of discharge vital signs were as follows:  VITAL SIGNS: Temperature 98, heart rate 84 per minute, respirations 18 per minute, blood pressure 128/82 mmHg. She was saturating 92%  on room air.   Pertinent Physical Examination on the date of discharge:  CARDIOVASCULAR: S1, S2 normal. No murmurs, rubs, or gallop.  LUNGS: Clear to auscultation bilaterally. No wheezing, rales, rhonchi, or crepitation.  ABDOMEN: Soft, benign.  NEUROLOGIC: Nonfocal examination.  All other physical examination remained at baseline.   DISCHARGE MEDICATIONS:  1. Amlodipine 5 mg p.o. daily.  2. Atorvastatin 40 mg p.o. at bedtime.  3. Aspirin 81 mg p.o. daily.  4. Metformin 1000 mg p.o. b.i.d.  5. Clozapine 100 mg half tablet p.o. every evening and (Dictation Anomaly) <MISSING TEXT>   tablet p.o. at bedtime.  6. Benztropine 2 mg p.o. daily.  7. Divalproex sodium 500 mg 2 tablets p.o. at bedtime.  8. Clonazepam 0.5 mg p.o. at bedtime.  9. Insulin NovoLog 70/30, 25 units subcutaneous daily.  10. Amitiza 24 mcg 1 capsule p.o. daily.  11. Omeprazole 20 mg p.o. b.i.d.  12. Tylenol 500 mg p.o. every 4 hours as needed.  13. Guaifenesin 5 mL p.o. every 4 hours as needed.  14. Milk of magnesia once daily as needed.  15. Dulcolax  1 tablet twice a day as needed.  16. Depakote ER 500 mg 2 tablets p.o. at bedtime.  17. Donepezil 23 mg p.o. at bedtime.  18. Paxil 40 mg p.o. daily.  19. Levaquin 500 mg p.o. daily for 5 more days.  20. Lasix 20 mg p.o. daily.  21. Potassium chloride 20 mEq p.o. at bedtime.   DISCHARGE DIET: Regular.   DISCHARGE ACTIVITY: As tolerated.   DISCHARGE INSTRUCTIONS AND FOLLOW-UP: The patient was instructed to follow up with her primary care physician, Dr. Levada SchillingNzingha  White in 2 to 4 weeks. She will need follow-up with OB/GYN, Dr. Annamarie Major, in 1 to 2 weeks.   TOTAL TIME DISCHARGING THIS PATIENT: 45  minutes.     ____________________________ Ellamae Sia. Sherryll Burger, MD vss:jh D: 07/06/2014 21:48:07 ET T: 07/06/2014 22:00:51 ET JOB#: [id number]  cc: Willadeen Colantuono S. Sherryll Burger, MD, <Dictator> Corliss Marcus. [facility] R. Annamarie Major, MD Laurier Nancy,  MD  Ellamae Sia [facility] MD ELECTRONICALLY SIGNED 07/11/2014 9:29

## 2015-04-02 ENCOUNTER — Emergency Department
Admit: 2015-04-02 | Disposition: A | Discharge status: Home / Self Care | Payer: Self-pay | Admitting: Emergency Medicine

## 2015-04-02 LAB — COMPREHENSIVE METABOLIC PANEL
AST: 19 U/L
Albumin: 3.8 g/dL
Alkaline Phosphatase: 73 U/L
Anion Gap: 11 (ref 7–16)
BILIRUBIN TOTAL: 0.3 mg/dL
BUN: 24 mg/dL — ABNORMAL HIGH
CHLORIDE: 101 mmol/L
CO2: 31 mmol/L
Calcium, Total: 9.3 mg/dL
Creatinine: 1.28 mg/dL — ABNORMAL HIGH
EGFR (African American): 55 — ABNORMAL LOW
GFR CALC NON AF AMER: 47 — AB
GLUCOSE: 79 mg/dL
POTASSIUM: 3.9 mmol/L
SGPT (ALT): 12 U/L — ABNORMAL LOW
Sodium: 143 mmol/L
Total Protein: 7.1 g/dL

## 2015-04-02 LAB — CBC
HCT: 38 % (ref 35.0–47.0)
HGB: 12.6 g/dL (ref 12.0–16.0)
MCH: 32 pg (ref 26.0–34.0)
MCHC: 33.2 g/dL (ref 32.0–36.0)
MCV: 97 fL (ref 80–100)
Platelet: 269 10*3/uL (ref 150–440)
RBC: 3.94 10*6/uL (ref 3.80–5.20)
RDW: 15.3 % — ABNORMAL HIGH (ref 11.5–14.5)
WBC: 9.1 10*3/uL (ref 3.6–11.0)

## 2015-04-02 LAB — URINALYSIS, COMPLETE
BACTERIA: NONE SEEN
BLOOD: NEGATIVE
Bilirubin,UR: NEGATIVE
GLUCOSE, UR: NEGATIVE mg/dL (ref 0–75)
Ketone: NEGATIVE
LEUKOCYTE ESTERASE: NEGATIVE
Nitrite: NEGATIVE
Ph: 6 (ref 4.5–8.0)
Protein: NEGATIVE
Specific Gravity: 1.006 (ref 1.003–1.030)

## 2015-04-02 LAB — VALPROIC ACID LEVEL: Valproic Acid: 127 ug/mL — ABNORMAL HIGH (ref 50–100)

## 2015-04-02 LAB — PROTIME-INR
INR: 0.9
Prothrombin Time: 12.1 secs

## 2015-04-02 LAB — APTT: ACTIVATED PTT: 29 s (ref 23.6–35.9)

## 2015-04-06 LAB — DIFFERENTIAL
Basophil #: 0.1 10*3/uL (ref 0.0–0.1)
Basophil %: 0.7 %
EOS ABS: 0.2 10*3/uL (ref 0.0–0.7)
EOS PCT: 1.9 %
LYMPHS ABS: 2.6 10*3/uL (ref 1.0–3.6)
Lymphocyte %: 32.3 %
MONO ABS: 0.5 x10 3/mm (ref 0.2–0.9)
Monocyte %: 6 %
NEUTROS PCT: 59.1 %
Neutrophil #: 4.8 10*3/uL (ref 1.4–6.5)

## 2015-04-06 LAB — URINALYSIS, COMPLETE
Bacteria: NONE SEEN
Bilirubin,UR: NEGATIVE
Blood: NEGATIVE
Glucose,UR: NEGATIVE mg/dL (ref 0–75)
Leukocyte Esterase: NEGATIVE
NITRITE: NEGATIVE
PROTEIN: NEGATIVE
Ph: 5 (ref 4.5–8.0)
SPECIFIC GRAVITY: 1.018 (ref 1.003–1.030)

## 2015-04-06 LAB — COMPREHENSIVE METABOLIC PANEL
Albumin: 3.5 g/dL
Alkaline Phosphatase: 64 U/L
Anion Gap: 19 — ABNORMAL HIGH (ref 7–16)
BILIRUBIN TOTAL: 0.1 mg/dL — AB
BUN: 27 mg/dL — ABNORMAL HIGH
CHLORIDE: 102 mmol/L
CREATININE: 1.48 mg/dL — AB
Calcium, Total: 8.6 mg/dL — ABNORMAL LOW
Co2: 23 mmol/L
EGFR (African American): 46 — ABNORMAL LOW
GFR CALC NON AF AMER: 40 — AB
Glucose: 104 mg/dL — ABNORMAL HIGH
POTASSIUM: 3.6 mmol/L
SGOT(AST): 29 U/L
SGPT (ALT): 12 U/L — ABNORMAL LOW
SODIUM: 144 mmol/L
Total Protein: 6.4 g/dL — ABNORMAL LOW

## 2015-04-06 LAB — CBC
HCT: 34.1 % — ABNORMAL LOW (ref 35.0–47.0)
HGB: 11.2 g/dL — ABNORMAL LOW (ref 12.0–16.0)
MCH: 32.2 pg (ref 26.0–34.0)
MCHC: 32.9 g/dL (ref 32.0–36.0)
MCV: 98 fL (ref 80–100)
Platelet: 252 10*3/uL (ref 150–440)
RBC: 3.49 10*6/uL — AB (ref 3.80–5.20)
RDW: 15.5 % — ABNORMAL HIGH (ref 11.5–14.5)
WBC: 8.2 10*3/uL (ref 3.6–11.0)

## 2015-04-06 LAB — VALPROIC ACID LEVEL: Valproic Acid: 30 ug/mL — ABNORMAL LOW (ref 50–100)

## 2015-04-07 ENCOUNTER — Observation Stay
Admit: 2015-04-07 | Discharge: 2015-04-07 | Disposition: A | Discharge status: Home / Self Care | Payer: Medicare Other | Source: Ambulatory Visit | Attending: Internal Medicine | Admitting: Internal Medicine

## 2015-04-07 MED ORDER — ASPIRIN 81 MG PO CHEW: 81.0000 mg | CHEWABLE_TABLET | Freq: Every day | ORAL | Status: DC

## 2015-04-07 MED ORDER — ACETAMINOPHEN 325 MG PO TABS: 650.0000 mg | ORAL_TABLET | ORAL | Status: DC | PRN

## 2015-04-07 MED ORDER — PAROXETINE HCL 20 MG PO TABS: 40.0000 mg | ORAL_TABLET | Freq: Every day | ORAL | Status: DC

## 2015-04-07 MED ORDER — CLOZAPINE 25 MG PO TABS
450.0000 mg | ORAL_TABLET | Freq: Every day | ORAL | Status: DC
  Filled 2015-04-07: qty 2

## 2015-04-07 MED ORDER — PANTOPRAZOLE SODIUM 40 MG PO TBEC: 40.0000 mg | DELAYED_RELEASE_TABLET | Freq: Every day | ORAL | Status: DC

## 2015-04-07 MED ORDER — HEPARIN SODIUM (PORCINE) 5000 UNIT/ML IJ SOLN: 5000.0000 [IU] | Freq: Three times a day (TID) | INTRAMUSCULAR | Status: DC

## 2015-04-07 MED ORDER — SENNA 8.6 MG PO TABS: 1.0000 | ORAL_TABLET | Freq: Two times a day (BID) | ORAL | Status: DC

## 2015-04-07 MED ORDER — INSULIN ASPART PROT & ASPART (70-30 MIX) 100 UNIT/ML ~~LOC~~ SUSP: 25.0000 [IU] | Freq: Every day | SUBCUTANEOUS | Status: DC

## 2015-04-07 MED ORDER — POTASSIUM CHLORIDE CRYS ER 20 MEQ PO TBCR: 20.0000 meq | EXTENDED_RELEASE_TABLET | Freq: Every day | ORAL | Status: DC

## 2015-04-07 MED ORDER — DIVALPROEX SODIUM 500 MG PO DR TAB: 500.0000 mg | DELAYED_RELEASE_TABLET | Freq: Every morning | ORAL | Status: DC

## 2015-04-07 MED ORDER — LINACLOTIDE 290 MCG PO CAPS
290.0000 ug | ORAL_CAPSULE | Freq: Every day | ORAL | Status: DC
  Filled 2015-04-07: qty 1

## 2015-04-07 MED ORDER — METFORMIN HCL 500 MG PO TABS: 1000.0000 mg | ORAL_TABLET | Freq: Two times a day (BID) | ORAL | Status: DC

## 2015-04-07 MED ORDER — CLOZAPINE 25 MG PO TABS
50.0000 mg | ORAL_TABLET | ORAL | Status: DC
  Filled 2015-04-07: qty 2

## 2015-04-07 MED ORDER — INSULIN GLARGINE 100 UNIT/ML ~~LOC~~ SOLN: 15.0000 [IU] | Freq: Every day | SUBCUTANEOUS | Status: DC

## 2015-04-07 MED ORDER — INSULIN ASPART 100 UNIT/ML ~~LOC~~ SOLN: 0.0000 [IU] | Freq: Three times a day (TID) | SUBCUTANEOUS | Status: DC

## 2015-04-07 MED ORDER — ATORVASTATIN CALCIUM 20 MG PO TABS: 40.0000 mg | ORAL_TABLET | Freq: Every day | ORAL | Status: DC

## 2015-04-07 MED ORDER — ONDANSETRON HCL 4 MG/2ML IJ SOLN: 4.0000 mg | INTRAMUSCULAR | Status: DC | PRN

## 2015-04-07 MED ORDER — DONEPEZIL HCL 23 MG PO TABS: 23.0000 mg | ORAL_TABLET | Freq: Every day | ORAL | Status: DC

## 2015-04-07 MED ORDER — TORSEMIDE 20 MG PO TABS: 100.0000 mg | ORAL_TABLET | Freq: Every morning | ORAL | Status: DC

## 2015-04-07 MED ORDER — DIVALPROEX SODIUM 250 MG PO DR TAB: 250.0000 mg | DELAYED_RELEASE_TABLET | Freq: Every day | ORAL | Status: DC

## 2015-04-07 MED ORDER — DOCUSATE SODIUM 100 MG PO CAPS: 100.0000 mg | ORAL_CAPSULE | Freq: Two times a day (BID) | ORAL | Status: DC | PRN

## 2015-04-07 MED ORDER — LUBIPROSTONE 24 MCG PO CAPS
24.0000 ug | ORAL_CAPSULE | Freq: Every day | ORAL | Status: DC
  Filled 2015-04-07: qty 1

## 2015-04-07 MED ORDER — PANTOPRAZOLE SODIUM 40 MG PO TBEC: 40.0000 mg | DELAYED_RELEASE_TABLET | ORAL | Status: DC

## 2015-04-08 NOTE — H&P (Signed)
PATIENT NAME:  Candice Sawyer, [name] MR#:  [id number] DATE OF BIRTH:  1961-04-28  DATE OF ADMISSION:  04/06/2015  PRIMARY CARE PHYSICIAN:  Dr. Maryellen PileEason.    CHIEF COMPLAINT: Seizures.   HISTORY OF PRESENT ILLNESS:  History is obtained from ER physician.  Some limited history from patient.  No caregiver in the Emergency Room during my evaluation, she was present earlier. Candice Sawyer is a 54 year old morbidly obese Caucasian female with past medical history of known seizure disorder, hypertension, diabetes, bipolar disorder, schizophrenia, and dementia, comes to the Emergency Room after she started having seizures, generalized [facility]. It was witnessed by caregivers who brought her to the Emergency Room and did have an episode of seizure here as well that was witnessed by the staff. The patient recently was seen in the Emergency Room a couple of days ago and her Depakote level was 127, the dose was decreased and repeat Depakote levels thereafter as an outpatient were around 50.  Her Depakote was resumed back to her original dose per Dr. Jake ChurchEason's recommendations. She comes in. The patient was also noted at one time in the Emergency Room to become hypoxic after her seizure episode. She received 1000 mg of IV Keppra per ER physician's discussion with neurology Dr. Loretha BrasilZeylikman. She is being admitted for overnight observation for her breakthrough seizures.   PAST MEDICAL HISTORY:   1. Type 2 diabetes.  2. Seizure disorder chronic.  3. Hypertension.  4. Morbid obesity.  5. Bipolar disorder.  6. Schizophrenia.  7. Dementia.  8. Coronary artery disease.   PAST SURGICAL HISTORY: Significant for endometrial polyp removed.   ALLERGIES: TO HALDOL, THORAZINE, TRAZODONE.   SOCIAL HISTORY: [facility]. No smoking, no drinking.   FAMILY HISTORY: From old records, heart disease in the family.   REVIEW OF SYSTEMS:    CONSTITUTIONAL: No fever,  fatigue, weakness.  EYES: No blurred or double vision, glaucoma, or cataract.  EARS, NOSE, AND THROAT: No tinnitus, ear pain, hearing loss, postnasal drip.  RESPIRATORY: No cough, wheeze, hemoptysis or dyspnea.  CARDIOVASCULAR: No chest pain, orthopnea, edema. Positive for hypertension.  GASTROINTESTINAL: No nausea, vomiting, diarrhea, abdominal pain, or hematemesis.  GENITOURINARY: No dysuria, hematuria, frequency.  ENDOCRINE: No polyuria, nocturia, or thyroid problems.  HEMATOLOGY: No anemia, easy bruising, or bleeding.  SKIN: No acne, rash, or lesion.  MUSCULOSKELETAL: Positive for arthritis and back pain.  NEUROLOGIC: No CVA, TIA, dysarthria. Positive for chronic seizures.  PSYCHIATRIC: No anxiety.  Positive for bipolar and schizophrenia.   All other systems reviewed and negative.   PHYSICAL EXAMINATION:  GENERAL: The patient is awake, alert, oriented x 3, not in acute distress.  VITAL SIGNS: Afebrile, pulse is 87, blood pressure is 134/60, saturations are 97% on room air.  HEENT: Atraumatic, normocephalic. Pupils PERRLA.  EOM intact. Oral mucosa is moist.  NECK: Supple. No JVD. No carotid bruit.  LUNGS: Clear to auscultation bilaterally. No rales, rhonchi, respiratory distress, or labored breathing.  HEART: Both the heart sounds are normal. Rate and rhythm regular. PMI not lateralized. Chest nontender.  EXTREMITIES: Good pedal pulses, good femoral pulses. No lower extremity edema.  ABDOMEN: Soft, benign, nontender. No organomegaly. Positive bowel sounds.  NEUROLOGIC: Grossly intact cranial nerves II through XII. No motor or sensory deficits.  PSYCHIATRIC: The patient is awake, alert, oriented x 3.   LABORATORY DATA:  White count is 8.2, H and H is 11.2 and 34.1, platelet count is 252,000,  MCV is 98. Glucose is 104, BUN is 27, creatinine is 1.48, sodium 144, potassium is 3.6, chloride 102, bicarbonate is 23, calcium is 8.6, bilirubin total is 0.1. Valproic acid level is 30.  UA   negative for UTI.      EKG shows normal sinus rhythm.   ASSESSMENT:  Prescilla Sawyer with history of chronic seizure disorder, dementia, schizophrenia, and type 2 diabetes, comes in with:   1.  Recurrent breakthrough seizures. Her dose of Depakote was recently decreased given her elevated Depakote levels and ataxia that she had experienced a few days ago and came to the Emergency Room for the same. The patient's Depakote levels were 127. The patient is currently on her routine dose of Depakote. She continues to have subtherapeutic levels of Depakote. She received one. She has been having recurrent seizures with transient hypoxia. She is going to be admitted to medical floor. Was started on Keppra, 1000 mg IV stat given, we will continue 500 b.i.d.  Neurologic consultation has been placed. We will monitor for breakthrough seizures. 2.  Type 2 diabetes. Continue her long-acting insulin along with sliding and metformin.  3.  Hypertension. Resume all home medication which is torsemide.  4.  Hyperlipidemia, on atorvastatin.  5.  History of schizophrenia, bipolar disorder, dementia. Continue her psychiatric medications which are clozapine and valproic acid, and donepezil.  6.  Deep vein thrombosis prophylaxis. Subcutaneous heparin t.i.d.   Further workup according to the patient's clinical course.   TIME SPENT: 50 minutes.      ____________________________ Wylie Hail Allena Katz, MD sap:bu D: 04/06/2015 13:19:57 ET T: 04/06/2015 13:44:29 ET JOB#: [id number]  cc: Isac Lincks A. Allena Katz, MD, <Dictator> Serita Sheller. Maryellen Pile, MD Willow Ora MD ELECTRONICALLY SIGNED 04/06/2015 15:21

## 2015-04-08 NOTE — H&P (Signed)
PATIENT NAME:  PARISH, AUGUSTINE MR#:  [id number] DATE OF BIRTH:  [birth year]  DATE OF ADMISSION:  12/15/2014  PRIMARY DOCTOR:  Alden Server B. Maryellen Pile, MD    EMERGENCY ROOM PHYSICIAN: DR.Shaevitz CHIEF COMPLAINT: Seizure.   HISTORY OF PRESENT ILLNESS: A 54 year old female with a history of mental retardation, seizure disorder, dementia comes in from a group home because of seizure. The patient is at [facility] and the patient was taken to a daycare facility and the staff noticed that she was having seizure episode while she was in the car. The patient noted to have seizure, so she was sent in here for further evaluation. The patient unable to give any other history due to her dementia. The patient had a prolonged postictal period so ER doctor was concerned and asked [place] to admit,.t. And Dr. Mellody Drown has been contacted by ER physician. He recommended Depakote 750 mg daily. The patient was on Depakote ER 500 mg daily. According to the sister, the patient has been diagnosed with UTI and has been on antibiotics for the last few days.   PAST MEDICAL HISTORY: Significant for history of hospitalization in October 2014 with  respiratory failure, intubated at that time. Other history:  Diabetes mellitus type 2, seizure disorder , hypertension, bipolar disorder, schizophrenia, dementia and coronary artery disease.   SURGICAL HISTORY: Significant for endometrial polyp removal.   ALLERGIES: HALDOL,  >, THORAZINE, TRAZODONE.   SOCIAL HISTORY: The patient right now is at [facility]. She was at home with 24-hour care giver . No smoking. No drinking.   FAMILY HISTORY: Significant for heart disease in the family. The   REVIEW OF SYSTEMS: Not obtainable secondary to her dementia. The patient's sister says that usually the patient is alert, oriented and she can spell her name but today she could not tell anything at this time.   MEDICATIONS: Lantus 25 units in the evening, Amitiza 25 mcg daily  for  constipation, aspirin 81 mg daily, metformin 1 mg p.o. b.i.d., atorvastatin 40 mg p.o. daily and donepezil 23 mg daily, KCl 20 mEq p.o. daily, Clozapine 100 mg tablet, half tablet by mouth in the evening  5 pmand (Dictation Anomaly) 41/2 tablets at bedtime, benztropine 2 mg b.i.d., Depakote ER 500 mg daily. NovoLog FlexPen 70/30, 25 units every day, Torsemide 20 mg p.o. daily, 2 tablets in the morning, omeprazole 20 mg p.o. daily. MOM as needed for constipation. PHYSICAL EXAMINATION:  VITALS: Temperature 97.8, heart rate 107, blood pressure 126/106,o2 sats> 95% on room air.  GENERAL: The patient is a 54 year old female patient awake but not oriented to time, place, person, unable to communicate much.  HEAD: Normocephalic, atraumatic.  EYES: Pupils equally reacting to light. No scleral icterus. The patient has no conjunctivitis.  NOSE: No external lesions. No turbinate hypertrophy.  EARS: No external lesions and external auditory canals are normal.  THROAT: No oropharyngeal erythema and no lymphadenopathy.  RESPIRATORY: Bilateral breath sounds present, clear to auscultation. No wheeze. No rales.  NECK: Supple. No JVD. No carotid bruit. No lymphadenopathy.  LUNGS: Clear to auscultation. No wheeze. No rales. Not using accessory muscles on respiration.  HEART: S1, S2 regular. No murmurs. No gallops.  ABDOMEN: Soft, nontender, nondistended. Bowel sounds present. No organomegaly.  EXTREMITIES: No extremity edema. No cyanosis, no clubbing. The patient's peripheral pulses are equal bilaterally in dorsalis pedis.  SKIN: Inspection is normal. No skin rashes.  LYMPHATICS: No cervical or axillary lymphadenopathy.  NEUROLOGICAL: The patient has no  gross focal neurological deficits and unable to participate in full neurological exam right now because of mental status.  PSYCHIATRIC: She is awake and not oriented at this time. The patient's sister says she usually is awake, oriented and can tell her name as  well.   LABORATORY DATA: Electrolytes:  Sodium 140, potassium 4, chloride 101, bicarbonate 24, BUN 18, creatinine 1.67, glucose 128, valproic acid 34. UA is clean. No bacteria. CBC: WBC 8.7, hemoglobin 12.8, hematocrit 40.5, platelets 262.    X-RAY FINDINGS: Chest x-ray did not show any consolidation or effusion.   EKG: EKG is not done.   ASSESSMENT AND PLAN:  1.  The patient is a 54 year old female with (break thru  seizures. The patient still, postictal was prolonged .Marland Kitchen.  The patient's Depakote is increased from 500 mg to 750 mg daily because of low therapeutic levels of Depakote on blood work and monitor her neurologically and obtain neurology consult with Dr. Mellody DrownMatthew Smith.  2.  The patient's other diagnoses include acute renal failure ( due to dehydration. Continue IV fluids and avoid nephrotoxic agents.  3.  The patient's other diagnosis includes history of dementia and schizophrenia. She is on Clozapine, Cogentin and Depakote. Continue them.  4.  . Potassium should be more than 4 and the patient's potassium is already very good at more than 4.  Recheck the magnesium.  For renal failure, continue fluids.  5.  Diabetes mellitus type 2. Hold metformin because of renal failure. Continue Lantus and continue sliding scale with coverage.  6.  Coronary artery disease. The patient is on aspirin, beta blockers, and statins and the patient is continued on aspirin, beta blockers, and statins and she was on Lasix but I am holding them because of renal failure.   7.  Dementia. She is on donepezil 23 mg daily. Continue that.   TIME SPENT: About 60 minutes on admission and discussed the plan with the patient's sister over the phone and patient's sister is Ms. Steward DroneBrenda ( ____________________________ Katha HammingSnehalatha Newel Oien, MD sk:AT D: 12/15/2014 20:26:16 ET T: 12/15/2014 21:33:59 ET JOB#: [id number]  cc: Katha HammingSnehalatha Jonmichael Beadnell, MD, <Dictator> Katha HammingSNEHALATHA Kamani Magnussen MD ELECTRONICALLY SIGNED 01/10/2015 18:41

## 2015-04-08 NOTE — Discharge Summary (Signed)
PATIENT NAME:  [name], Candice J MR#:  [id number] DATE OF BIRTH:  10/06/1961  DATE OF ADMISSION:  12/15/2014 DATE OF DISCHARGE:  12/17/2014  PRESENTING COMPLAINT: Seizures.   DISCHARGE DIAGNOSES:  1.  Breakthrough seizure. The patient has been seizure-free since admission.  2.  History of chronic seizure disorder.  3.  Acute renal failure, mild, resolved.  4.  History of dementia and schizophrenia.  5.  Type 2 diabetes.  6.  Morbid obesity.  7.  Coronary artery disease.   CODE STATUS: FULL CODE.   DISPOSITION: [facility]   MEDICATIONS:  1.  Metformin 1000 mg b.i.d.  2.  Benztropine 2 mg b.i.d.  3.  Paxil 40 mg daily.  4.  Clozapine 100 mg 1/2 tablet once a day at 5:00 p.m. 4-1/2 tablets at bedtime.  5.  Tylenol 500 mg every 4 hours as needed.  6.  Lantus 25 units in the evening.  7.  Amitiza 24 mcg p.o. daily.  8.  Aspirin 81 mg daily.  9.  Milk of magnesia as needed.  10.  Atorvastatin 40 mg p.o. daily at bedtime.  11.  Donepezil 23 mg p.o. daily.  12.  NovoLog 70/30 at 25 units subcutaneous daily.  13.  Torsemide 20 mg 2 tablets daily.  14.  Omeprazole 20 mg p.o. b.i.d.  15.  Guaifenesin as needed.  16.  DOK Plus 50 mg/8.6 mg 1 tablet b.i.d. as needed for constipation.  17.  Divalproex sodium 250 mg extended-release 3 tablets once a day.   DIET: Carbohydrate-controlled diet.   FOLLOWUP:  1.  With Dr. Maryellen PileEason in 1 to 2 weeks.  2.  [facility] with [facility] in 4 to 6 weeks as needed.   H and H  is 11.4 and 34.7. Creatinine is 1.13.   NEUROLOGY CONSULTATION: Troy SineMatthew C. Katrinka BlazingSmith, MD   BRIEF SUMMARY OF HOSPITAL COURSE: Candice SoursLinda Cabana is a 54 year old Caucasian female with past medical history of chronic seizure disorder, comes in with:  1.  Recurrent breakthrough seizures. Her dose for Depakote was increased to 750 mg extended release daily because of low-therapeutic levels. This was discussed with Dr. Mellody DrownMatthew Smith, neurology, who  recommended it. CT head was negative. The patient was mentating well. She remained seizure-free in the hospital.  2.  Acute renal failure disease. Received IV fluids. The patient appears euvolemic.  3.  History of dementia and schizophrenia. She is on Cogentin and clozapine.  4.  History of dementia, on donepezil.  5.  Type 2 diabetes. Resumed back on metformin and Lantus along with sliding scale coverage. She is on Novolin 70/30 as well.  6.  Coronary artery disease. Continued aspirin, beta blockers, and statins.   Overall hospital stay was stable.   CODE STATUS: The patient remained a FULL CODE.   TIME SPENT: 40 minutes.    ____________________________ Wylie HailSona A. Allena KatzPatel, MD sap:ts D: 12/17/2014 10:20:13 ET T: 12/17/2014 17:27:31 ET JOB#: [id number]  cc: Shaquille Janes A. Allena KatzPatel, MD, <Dictator> Serita ShellerErnest B. Maryellen PileEason, MD Willow OraSONA A Kiahna Banghart MD ELECTRONICALLY SIGNED 12/21/2014 11:20

## 2015-04-08 NOTE — Consult Note (Signed)
Referring Physician:  Epifanio Lesches :   Primary Care Physician:   Epifanio Lesches : [facility], [facility], [address], [place], [place] [address]  Reason for Consult: Admit Date: 16-Dec-2014  Chief Complaint: seizure  Reason for Consult: seizure   History of Present Illness: History of Present Illness:   54 yo RHD F presents to [facility] secondary to seizure while at [facility].  Pt has known hx of seizure for 20+ years and had one prior about 4 months ago.  Her baseline is moderate cognitive dysfunction and needs help with most ADLs.  Today she is more alert and has no complaints.  ROS:  General denies complaints    HEENT no complaints    Lungs no complaints    Cardiac no complaints    GI no complaints    GU no complaints    Musculoskeletal no complaints    Extremities no complaints    Skin no complaints    Neuro no complaints    Endocrine no complaints    Psych no complaints    Past Medical/Surgical Hx:  gait dis:   memory loss:   Schizophrenia:   Bipolar Disorder:   Seizures:   gerd:   Hyperlipidemia:   htn:   cad:   Diabetes:   Past Medical/ Surgical Hx:  Past Medical History reviewed by me as above   Past Surgical History reviewed  by me as above   Home Medications: Medication Instructions Last Modified Date/Time  Lantus Solostar Pen 100 units/mL subcutaneous solution 25 unit(s) subcutaneous once a day (in the evening) 08-Jan-16 20:05  Amitiza 24 mcg oral capsule 1 cap(s) orally once a day 08-Jan-16 20:05  Aspirin Enteric Coated 81 mg oral delayed release tablet 1 tab(s) orally once a day 08-Jan-16 20:05  Milk of Magnesia milliliter(s) orally , As Needed per standard PRN orders.  08-Jan-16 20:05  atorvastatin 40 mg oral tablet 1 tab(s) orally once a day (at bedtime) 08-Jan-16 20:05  donepezil 23 mg oral tablet 1 tab(s) orally once a day 08-Jan-16 20:05  potassium chloride 20 mEq oral tablet, extended  release 1 tab(s) orally once a day 08-Jan-16 20:05  divalproex sodium 500 mg oral tablet, extended release 1 tab(s) orally once a day 08-Jan-16 20:05  NovoLOG Mix 70/30 FlexPen 25 unit(s) subcutaneous once a day 08-Jan-16 20:05  torsemide 20 mg oral tablet 2 tab(s) orally once a day (in the morning) 08-Jan-16 20:05  omeprazole 20 mg oral delayed release capsule 1 cap(s) orally 2 times a day 08-Jan-16 20:05  guaiFENesin 100 mg/5 mL oral liquid milliliter(s) orally , As Needed per standard PRN orders. 08-Jan-16 20:05  Dok Plus 50 mg-8.6 mg oral tablet 1 tab(s) orally 2 times a day, As Needed - for Constipation 08-Jan-16 20:05  metFORMIN 1000 mg oral tablet 1 tab(s) orally 2 times a day 08-Jan-16 20:05  benztropine 2 mg oral tablet 1 tab(s) orally 2 times a day 08-Jan-16 20:05  PARoxetine 40 mg oral tablet 1 tab(s) orally once a day 08-Jan-16 20:05  cloZAPine 100 mg oral tablet 0.5 tab(s) orally once a day at 5pm and 4.5 tablets at bedtime.  08-Jan-16 20:05  Tylenol 500 mg oral tablet 1 tab(s) orally every 4 hours, As Needed - for Pain 08-Jan-16 20:05   Allergies:  Haloperidol: Anaphylaxis  Stelazine: Anaphylaxis  Thorazine: Unknown  Trazodone: Unknown  losartan: Other  Allergies:  Allergies as above    Social/Family History: Employment Status: disabled  Lives With: alone  Living  Arrangements: extended care facility  Social History: no tob, no EtOH, no illicits  Family History: no seizure, no strokes   Vital Signs: **Vital Signs.:   09-Jan-16 07:36  Vital Signs Type Routine  Temperature Temperature (F) 98  Celsius 36.6  Temperature Source oral  Pulse Pulse 86  Respirations Respirations 18  Systolic BP Systolic BP 174  Diastolic BP (mmHg) Diastolic BP (mmHg) 64  Mean BP 82  Pulse Ox % Pulse Ox % 92  Pulse Ox Activity Level  At rest  Oxygen Delivery Room Air/ 21 %   Physical Exam: General: overweight, NAD  HEENT: normocephalic, sclera nonicteric, oropharynx clear  Neck:  supple, no JVD, no bruits  Chest: CTA B, no wheezing, good movement  Cardiac: RRR, no murmurs, no edema, 2+ pulses  Extremities: no C/C/E, FROM   Neurologic Exam: Mental Status: alert and oriented x 2 not all of time, normal speech and language, follows simple commands  Cranial Nerves: PERRLA, EOMI, nl VF, face symmetric, tongue midline, shoulder shrug equal  Motor Exam: 5/5 B, normal tone, no tremor, oral dyskinesias notes with slight L hand choreaform movements  Deep Tendon Reflexes: 1+/4 B, plantars downgoing B, no Hoffman  Sensory Exam: pinprick, temperature, and vibration intact B  Coordination: FTN and HTS WNL, nl RAM   Lab Results:  TDMs:  08-Jan-16 16:55   Valproic Acid, Serum  34 (50-100 POTENTIALLY TOXIC:  > 200 mcg/mL)  Routine Chem:  09-Jan-16 05:18   Glucose, Serum  42  BUN 12  Creatinine (comp) 1.13  Sodium, Serum 144  Potassium, Serum 4.3  Chloride, Serum 107  CO2, Serum 30  Calcium (Total), Serum  8.2  Anion Gap 7  Osmolality (calc) 283  eGFR (African American) >60  eGFR (Non-African American)  54 (eGFR values <42m/min/1.73 m2 may be an indication of chronic kidney disease (CKD). Calculated eGFR, using the MRDR Study equation, is useful in  patients with stable renal function. The eGFR calculation will not be reliable in acutely ill patients when serum creatinine is changing rapidly. It is not useful in patients on dialysis. The eGFR calculation may not be applicable to patients at the low and high extremes of body sizes, pregnant women, and vegetarians.)  Magnesium, Serum 1.9 (1.8-2.4 THERAPEUTIC RANGE: 4-7 mg/dL TOXIC: > 10 mg/dL  -----------------------)  Routine UA:  08-Jan-16 16:55   Color (UA) Yellow  Clarity (UA) Clear  Glucose (UA) Negative  Bilirubin (UA) Negative  Ketones (UA) Trace  Specific Gravity (UA) 1.014  Blood (UA) Negative  pH (UA) 5.0  Protein (UA) Negative  Nitrite (UA) Negative  Leukocyte Esterase (UA) Negative (Result(s)  reported on 15 Dec 2014 at 05:47PM.)  RBC (UA) <1 /HPF  WBC (UA) 4 /HPF  Bacteria (UA) NONE SEEN  Epithelial Cells (UA) NONE SEEN  Mucous (UA) PRESENT  Hyaline Cast (UA) 12 /LPF (Result(s) reported on 15 Dec 2014 at 05:47PM.)  Routine Hem:  09-Jan-16 05:18   WBC (CBC) 8.9  RBC (CBC)  3.69  Hemoglobin (CBC)  11.4  Hematocrit (CBC)  34.7  Platelet Count (CBC) 242  MCV 94  MCH 30.8  MCHC 32.7  RDW  16.2  Neutrophil % 62.8  Lymphocyte % 28.2  Monocyte % 7.5  Eosinophil % 1.1  Basophil % 0.4  Neutrophil # 5.6  Lymphocyte # 2.5  Monocyte # 0.7  Eosinophil # 0.1  Basophil # 0.0 (Result(s) reported on 16 Dec 2014 at 0[facility])   Radiology Results: CT:    08-Jan-16 18:14, CT  Head Without Contrast  CT Head Without Contrast   REASON FOR EXAM:    prolonged post ictal state  COMMENTS:       PROCEDURE: CT  - CT HEAD WITHOUT CONTRAST  - Dec 15 2014  6:14PM     CLINICAL DATA:  Seizure    EXAM:  CT HEAD WITHOUT CONTRAST    TECHNIQUE:  Contiguous axial images were obtained from the base of the skull  through the vertex without intravenous contrast.    COMPARISON:  07/10/2014  FINDINGS:  Motion degraded images.    No evidence of parenchymal hemorrhage or extra-axial fluid  collection. No mass lesion, mass effect, or midline shift.    No CT evidence of acute infarction.    Ventricles are at the upper limits of normal for size, stable.    The visualized paranasal sinuses are essentially clear. The mastoid  air cells are unopacified.    No evidence of calvarial fracture.   IMPRESSION:  Motion degraded images.    Normal head CT.      Electronically Signed    By: Julian Hy M.D.    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 facial movements and L arm movements and this is likely due to antipsychotics agree with load of Depacon 546m then increasing depakote XR to 7579mdaily increase Cogentin to 74m67mID continue antipsychotics at current dose no driving or operating heavy machinery x 6 months will sign off, please call with questions [facility]  Electronic Signatures: SmiJamison NeighborD)  (Signed 09-Jan-16 19:14)  Authored: REFERRING PHYSICIAN, Primary Care Physician, Consult, History of Present Illness, Review of Systems, PAST MEDICAL/SURGICAL HISTORY, HOME MEDICATIONS, ALLERGIES, Social/Family History, NURSING VITAL SIGNS, Physical Exam-, LAB RESULTS, RADIOLOGY RESULTS, Recommendations   Last Updated: 09-Jan-16 19:14 by SmiJamison NeighborD)

## 2015-04-09 NOTE — Discharge Summary (Addendum)
PATIENT NAME:  Candice Sawyer, Candice Sawyer MR#:  [id number] DATE OF BIRTH:  [birth year]  DATE OF ADMISSION:  04/06/2015 DATE OF DISCHARGE:  04/07/2015  ADMISSION DIAGNOSIS: Seizures with a history of seizure disorder.   DISCHARGE DIAGNOSES: 1.  Seizure with recurrent breakthrough.  2.  Type 2 diabetes.  3.  Hypertension.  4.  Hyperlipidemia.  5.  History of schizophrenia.   CONSULTATIONS: Neurology.   PHYSICAL EXAMINATION AT DISCHARGE:  VITAL SIGNS: Temperature 97.5, pulse 80, respirations 20, blood pressure 111/78, 94% on room air.  GENERAL: The patient was alert, not in acute distress.  CARDIOVASCULAR: Regular rate and rhythm. No murmurs, gallops, or rubs.  LUNGS: Clear to auscultation without crackles, rales, rhonchi or wheezing.   ABDOMEN: Bowel sounds positive. Nontender, nondistended. No hepatosplenomegaly.  NEUROLOGIC: Cranial nerves II through XII are intact. There are no focal deficits.   HOSPITAL COURSE: A 54 year old female who presented after a seizure. For further details, please refer to the H and P.  1.  Seizures with a history of seizures. She had recurrent breakthrough seizures. Her dose of Depakote was recently decreased given her elevated Depakote level and ataxia about a week ago prior to this admission. She continues to have subtherapeutic levels of Depakote. She had recurrent seizures and therefore was admitted to the hospitalist service. She was then started on Keppra after being loaded with Keppra 100 mg IV in the ER. Neurology consultation has been placed.  2.  Type 2 diabetes. The patient will continue her outpatient medications.  3.  Essential hypertension. The patient is on torsemide.  4.  History of hyperlipidemia. The patient is on atorvastatin.  5.  History of schizophrenia. The patient will continue her outpatient medications.   DISCHARGE MEDICATIONS:  1.  Paxil 40 mg daily.  2.  Amitiza 24 mcg daily.  3.  Aspirin 81 mg daily.  4.  Atorvastatin 40 mg daily.  5.   Potassium 20 mEq daily.  6.  Omeprazole 20 mg b.i.d.  7. calcium plus 1 tablet b.i.d. p.r.n.  8.  Metformin 1000 b.i.d.  9.  Donepezil 23 mg at bedtime.  10.   Novolin 70/30 with 25 units daily.  11.   Depakote 250 mg in the morning.  12.   Clozapine 100 mg, 4.5 tablets at bedtime.  13.   Lantus 15 units in the evening.  14.   Torsemide 100 mg daily.  15.   Linzess 290 mcg daily.  16.   Milk of magnesia 15 mL daily p.r.n.  17.   Depakote 250 mg 2 tablets in the morning.   Discharge home health with physical therapy.   DISCHARGE DIET: ADA diet.   DISCHARGE ACTIVITY: As tolerated.   DISCHARGE FOLLOWUP: The patient will follow up with Dr. Maryellen PileEason.   TIME SPENT: 35 minutes.     ____________________________ Janyth ContesSital P. Juliene PinaMody, MD spm:at D: 04/07/2015 12:36:49 ET T: 04/07/2015 13:12:51 ET JOB#: [id number]  cc: Oluwafemi Villella P. Juliene PinaMody, MD, <Dictator> Serita ShellerErnest B. Maryellen PileEason, MD Janyth ContesSITAL P Kaneisha Ellenberger MD ELECTRONICALLY SIGNED 04/13/2015 13:31

## 2015-04-09 NOTE — Consult Note (Addendum)
PATIENT NAME:  [name], Candice J MR#:  [id number] DATE OF BIRTH:  September 22, 1961  DATE OF CONSULTATION:  04/07/2015  CONSULTING PHYSICIAN:  Jo-Anne Kluth P. Ramere Downs, MD  ADDENDUM: After speaking with the neurologist, his recommendations are to keep the patient on Depakote but decreasing her dose to 250 b.i.d. We will not continue the Keppra 500 b.i.d. Again, Depakote 250 mg p.o. b.i.d. and discontinue Keppra.     ____________________________ Candice Macpherson P. Juliene PinaMody, MD spm:TT D: 04/07/2015 13:13:46 ET T: 04/07/2015 14:37:48 ET JOB#: [id number]  cc: Candice Chestnutt P. Juliene PinaMody, MD, <Dictator> Janyth ContesSITAL P [name] MD ELECTRONICALLY SIGNED 04/13/2015 13:31

## 2015-04-09 NOTE — Consult Note (Addendum)
PATIENT NAME:  Candice Sawyer, Candice Sawyer MR#:  [id number] DATE OF BIRTH:  Oct 18, 1961  DATE OF CONSULTATION:  04/07/2015  CONSULTING PHYSICIAN:  Pauletta BrownsYuriy Camari Quintanilla, MD  REASON FOR CONSULTATION: Seizure-like activity.   HISTORY OF PRESENT ILLNESS: A 54 year old, morbidly obese, Caucasian female with past medical history of known seizure disorder, hypertension, bipolar, schizophrenia, dementia, presented after having an episode generalized tonic-clonic seizure. The patient had a recent admission to the emergency department with ataxia and with elevated valproic acid level of 127. Depakote was stopped and the patient came in with Depakote level of 50, which is the suspected reason for her seizure activity. Here in the hospital, the patient was given Keppra and started on Keppra.   PAST MEDICAL HISTORY: Type 2 diabetes, seizure disorder, hypertension, morbid obesity, bipolar disorder, schizophrenia, dementia, coronary artery disease.   ALLERGIES: INCLUDE HALDOL, THORAZINE AND TRAZODONE.   REVIEW OF SYSTEMS: No shortness of breath. No chest pain. Positive for history of schizophrenia. Positive history for bipolar disorder.   PHYSICAL EXAMINATION: NEUROLOGIC: The patient is alert, awake and tells me the time, date and the reason why she is in the hospital. Facial sensation intact. Facial motor is intact. Tongue is midline. Uvula elevates symmetrically. Shoulder shrug intact. Motor 4+/5 bilateral upper and lower extremities. Sensation intact to light touch and temperature. Coordination: Finger-to-nose intact. Gait not assessed.   IMPRESSION: A 54 year old female with known history of seizure disorder presents seizure activity, now back to baseline. The patient was started on Keppra.   PLAN: I would discontinue the Keppra because in the setting of schizophrenia and bipolar disorder, Keppra is not recommended, but would restart Depakote. The patient is status post 1 gram load of Depakote.   DISCHARGE PLANNING: Today.  The patient was discussed with the primary team.  Thank you for allowing me to see this patient.     ____________________________ Pauletta BrownsYuriy Lilyanne Mcquown, MD yz:TT D: 04/07/2015 13:42:40 ET T: 04/07/2015 15:54:29 ET JOB#: [id number]  cc: Pauletta BrownsYuriy Cashtyn Pouliot, MD, <Dictator> Pauletta BrownsYURIY Nasim Habeeb MD ELECTRONICALLY SIGNED 04/26/2015 17:14

## 2015-04-15 ENCOUNTER — Emergency Department: Payer: Medicare Other

## 2015-04-15 ENCOUNTER — Encounter: Payer: Self-pay | Admitting: Emergency Medicine

## 2015-04-15 ENCOUNTER — Emergency Department
Admission: EM | Admit: 2015-04-15 | Discharge: 2015-04-15 | Disposition: A | Discharge status: Home / Self Care | Payer: Medicare Other | Attending: Emergency Medicine | Admitting: Emergency Medicine

## 2015-04-15 ENCOUNTER — Other Ambulatory Visit: Payer: Self-pay

## 2015-04-15 DIAGNOSIS — F329 Major depressive disorder, single episode, unspecified: Secondary | ICD-10-CM | POA: Diagnosis not present

## 2015-04-15 DIAGNOSIS — Z7982 Long term (current) use of aspirin: Secondary | ICD-10-CM | POA: Insufficient documentation

## 2015-04-15 DIAGNOSIS — Z79899 Other long term (current) drug therapy: Secondary | ICD-10-CM | POA: Insufficient documentation

## 2015-04-15 DIAGNOSIS — R569 Unspecified convulsions: Secondary | ICD-10-CM | POA: Diagnosis not present

## 2015-04-15 DIAGNOSIS — F209 Schizophrenia, unspecified: Secondary | ICD-10-CM | POA: Insufficient documentation

## 2015-04-15 DIAGNOSIS — G40409 Other generalized epilepsy and epileptic syndromes, not intractable, without status epilepticus: Secondary | ICD-10-CM | POA: Insufficient documentation

## 2015-04-15 DIAGNOSIS — G40309 Generalized idiopathic epilepsy and epileptic syndromes, not intractable, without status epilepticus: Secondary | ICD-10-CM

## 2015-04-15 DIAGNOSIS — Z794 Long term (current) use of insulin: Secondary | ICD-10-CM | POA: Diagnosis not present

## 2015-04-15 DIAGNOSIS — I10 Essential (primary) hypertension: Secondary | ICD-10-CM | POA: Insufficient documentation

## 2015-04-15 DIAGNOSIS — E119 Type 2 diabetes mellitus without complications: Secondary | ICD-10-CM | POA: Insufficient documentation

## 2015-04-15 HISTORY — DX: Unspecified convulsions: R56.9

## 2015-04-15 HISTORY — DX: Type 2 diabetes mellitus without complications: E11.9

## 2015-04-15 LAB — BASIC METABOLIC PANEL
ANION GAP: 15 (ref 5–15)
BUN: 20 mg/dL (ref 6–20)
CO2: 25 mmol/L (ref 22–32)
Calcium: 8.9 mg/dL (ref 8.9–10.3)
Chloride: 102 mmol/L (ref 101–111)
Creatinine, Ser: 1.18 mg/dL — ABNORMAL HIGH (ref 0.44–1.00)
GFR calc non Af Amer: 51 mL/min — ABNORMAL LOW (ref >60)
GFR, EST AFRICAN AMERICAN: 59 mL/min — AB (ref >60)
GLUCOSE: 93 mg/dL (ref 65–99)
POTASSIUM: 4 mmol/L (ref 3.5–5.1)
SODIUM: 142 mmol/L (ref 135–145)

## 2015-04-15 LAB — VALPROIC ACID LEVEL: Valproic Acid Lvl: 24 ug/mL — ABNORMAL LOW (ref 50.0–100.0)

## 2015-04-15 LAB — CBC
HCT: 36.9 % (ref 35.0–47.0)
Hemoglobin: 12.2 g/dL (ref 12.0–16.0)
MCH: 32 pg (ref 26.0–34.0)
MCHC: 32.9 g/dL (ref 32.0–36.0)
MCV: 97.1 fL (ref 80.0–100.0)
Platelets: 257 10*3/uL (ref 150–440)
RBC: 3.8 MIL/uL (ref 3.80–5.20)
RDW: 15.1 % — AB (ref 11.5–14.5)
WBC: 8.1 10*3/uL (ref 3.6–11.0)

## 2015-04-15 LAB — GLUCOSE, CAPILLARY
GLUCOSE-CAPILLARY: 89 mg/dL (ref 70–99)
Glucose-Capillary: 85 mg/dL (ref 70–99)

## 2015-04-15 MED ORDER — DIVALPROEX SODIUM 250 MG PO DR TAB: 500.0000 mg | DELAYED_RELEASE_TABLET | Freq: Three times a day (TID) | ORAL | Status: AC

## 2015-04-15 MED ORDER — DIVALPROEX SODIUM 500 MG PO DR TAB
500.0000 mg | DELAYED_RELEASE_TABLET | ORAL | Status: AC
  Administered 2015-04-15: 500 mg via ORAL

## 2015-04-15 MED ORDER — DIVALPROEX SODIUM 500 MG PO DR TAB
DELAYED_RELEASE_TABLET | ORAL | Status: AC
  Administered 2015-04-15: 500 mg via ORAL
  Filled 2015-04-15: qty 1

## 2015-04-15 MED ORDER — DIVALPROEX SODIUM 500 MG PO DR TAB
DELAYED_RELEASE_TABLET | ORAL | Status: AC
  Filled 2015-04-15: qty 2

## 2015-04-15 MED ORDER — DIVALPROEX SODIUM 500 MG PO DR TAB
1000.0000 mg | DELAYED_RELEASE_TABLET | Freq: Once | ORAL | Status: AC
  Administered 2015-04-15: 1000 mg via ORAL

## 2015-04-15 NOTE — ED Notes (Addendum)
[place]. [facility]  [phone]  Gilford RileBrenda  Hudson (sister) [phone]

## 2015-04-15 NOTE — Consult Note (Signed)
CC: seizures  HPI: Candice Sawyer is an 54 y.o. female 5morbidly obese, Caucasian female with past medical history of known seizure disorder, hypertension, bipolar, schizophrenia, dementia, presented after having an episode generalized tonic-clonic seizure. The patient had a recent admission to the emergency department with ataxia and with elevated valproic acid level of 127, d/c with stopping it and readmitted with level of 30. Pt was restarted on depakote but had another seizure with admission level of 24 today. Unclear if she was taking it.  Now back to baseline.    Past Medical History  Diagnosis Date  . Seizures   . Diabetes mellitus without complication     History reviewed. No pertinent past surgical history.  History reviewed. No pertinent family history.  Social History:  reports that she has never smoked. She does not have any smokeless tobacco history on file. She reports that she does not drink alcohol or use illicit drugs.  Allergies  Allergen Reactions  . Haloperidol And Related Anaphylaxis  . Stelazine [Trifluoperazine] Anaphylaxis    Other reaction(s): Unknown  . Bupropion     Other reaction(s): Unknown  . Haloperidol Lactate     Other reaction(s): Unknown  . Losartan   . Losartan Potassium     Other reaction(s): Unknown  . Thorazine [Chlorpromazine] Other (See Comments)    Other reaction(s): Unknown Unkown  . Trazodone And Nefazodone Other (See Comments)    unknown    Medications: I have reviewed the patient's current medications.  ROS: History obtained from the patient  General ROS: negative for - chills, fatigue, fever, night sweats, weight gain or weight loss Psychological ROS: positive for - schizophrenia, depression.   Ophthalmic ROS: negative for - blurry vision, double vision, eye pain or loss of vision ENT ROS: negative for - epistaxis, nasal discharge, oral lesions, sore throat, tinnitus or vertigo Allergy and Immunology ROS: negative for -  hives or itchy/watery eyes Hematological and Lymphatic ROS: negative for - bleeding problems, bruising or swollen lymph nodes Endocrine ROS: negative for - galactorrhea, hair pattern changes, polydipsia/polyuria or temperature intolerance Respiratory ROS: negative for - cough, hemoptysis, shortness of breath or wheezing Cardiovascular ROS: negative for - chest pain, dyspnea on exertion, edema or irregular heartbeat Gastrointestinal ROS: negative for - abdominal pain, diarrhea, hematemesis, nausea/vomiting or stool incontinence Genito-Urinary ROS: negative for - dysuria, hematuria, incontinence or urinary frequency/urgency Musculoskeletal ROS: negative for - joint swelling or muscular weakness Neurological ROS: as noted in HPI Dermatological ROS: negative for rash and skin lesion changes  Physical Examination: Blood pressure 119/61, pulse 91, resp. rate 18, height 5\' 2"  (1.575 m), weight 128.51 kg (283 lb 5 oz), SpO2 100 %.  HEENT-  Normocephalic, no lesions, without obvious abnormality.  Normal external eye and conjunctiva.  Normal TM's bilaterally.  Normal auditory canals and external ears. Normal external nose, mucus membranes and septum.  Normal pharynx. Cardiovascular- regular rate and rhythm, S1, S2 normal, no murmur, click, rub or gallop, pulses palpable throughout   Lungs- chest clear, no wheezing, rales, normal symmetric air entry, Heart exam - S1, S2 normal, no murmur, no gallop, rate regular Abdomen- soft, non-tender; bowel sounds normal; no masses,  no organomegaly Extremities- no clubbing Lymph-no adenopathy palpable Musculoskeletal-no joint tenderness, deformity or swelling, no muscular tenderness noted Skin-warm and dry, no hyperpigmentation, vitiligo, or suspicious lesions  Neurological Examination Mental Status: Alert, oriented, thought content appropriate.  Speech fluent without evidence of aphasia.  Able to follow 3 step commands without difficulty. Cranial Nerves:  II:  Discs flat bilaterally; Visual fields grossly normal, pupils equal, round, reactive to light and accommodation III,IV, VI: ptosis not present, extra-ocular motions intact bilaterally V,VII: smile symmetric, facial light touch sensation normal bilaterally VIII: hearing normal bilaterally IX,X: gag reflex present XI: bilateral shoulder shrug XII: midline tongue extension Motor: Right : Upper extremity   4+/5    Left:     Upper extremity   4+/5  Lower extremity   4+/5     Lower extremity   4+/5 Tone and bulk:normal tone throughout; no atrophy noted Sensory: Pinprick and light touch intact throughout, bilaterally Deep Tendon Reflexes: 2+ and symmetric throughout Plantars: Right: downgoing   Left: downgoing Cerebellar: normal finger-to-nose, normal rapid alternating movements and normal heel-to-shin test Gait: not examined.       Laboratory Studies:   Basic Metabolic Panel:  Recent Labs Lab 04/15/15 0757  NA 142  K 4.0  CL 102  CO2 25  GLUCOSE 93  BUN 20  CREATININE 1.18*  CALCIUM 8.9    Liver Function Tests: No results for input(s): AST, ALT, ALKPHOS, BILITOT, PROT, ALBUMIN in the last 168 hours. No results for input(s): LIPASE, AMYLASE in the last 168 hours. No results for input(s): AMMONIA in the last 168 hours.  CBC:  Recent Labs Lab 04/15/15 0757  WBC 8.1  HGB 12.2  HCT 36.9  MCV 97.1  PLT 257    Cardiac Enzymes: No results for input(s): CKTOTAL, CKMB, CKMBINDEX, TROPONINI in the last 168 hours.  BNP: Invalid input(s): POCBNP  CBG: No results for input(s): GLUCAP in the last 168 hours.  Microbiology: Results for orders placed or performed in visit on 08/26/13  Culture, blood (single)     Status: None   Collection Time: 08/26/13 11:12 AM  Result Value Ref Range Status   Micro Text Report   Final       COMMENT                   NO GROWTH AEROBICALLY/ANAEROBICALLY IN 5 DAYS   ANTIBIOTIC                                                       Culture, blood (single)     Status: None   Collection Time: 08/26/13 11:17 AM  Result Value Ref Range Status   Micro Text Report   Final       COMMENT                   NO GROWTH AEROBICALLY/ANAEROBICALLY IN 5 DAYS   ANTIBIOTIC                                                      Bronchial Wash Culture     Status: None   Collection Time: 09/02/13 11:56 AM  Result Value Ref Range Status   Micro Text Report   Final       COMMENT                   APPEARS TO BE NORMAL FLORA AT 48 HOURS   GRAM STAIN  GOOD SPECIMEN-80-90% WBC   GRAM STAIN                MODERATE WHITE BLOOD CELLS   GRAM STAIN                NO ORGANISMS SEEN   ANTIBIOTIC                                                      Clostridium Difficile by PCR     Status: None   Collection Time: 09/05/13  7:29 AM  Result Value Ref Range Status   Micro Text Report   Final       COMMENT                   NEGATIVE-CLOS.DIFFICILE TOXIN NOT DETECTED BY PCR   ANTIBIOTIC                                                      Culture, expectorated sputum-assessment     Status: None   Collection Time: 09/06/13  3:58 AM  Result Value Ref Range Status   Micro Text Report   Final       ORGANISM 1                MODERATE GROWTH CANDIDA ALBICANS   GRAM STAIN                GOOD SPECIMEN-80-90% WBC   GRAM STAIN                MANY WHITE BLOOD CELLS   GRAM STAIN                NO ORGANISMS SEEN   ANTIBIOTIC                    ORG#1                                                 Coagulation Studies: No results for input(s): LABPROT, INR in the last 72 hours.  Urinalysis: No results for input(s): COLORURINE, LABSPEC, PHURINE, GLUCOSEU, HGBUR, BILIRUBINUR, KETONESUR, PROTEINUR, UROBILINOGEN, NITRITE, LEUKOCYTESUR in the last 168 hours.  Invalid input(s): APPERANCEUR  Lipid Panel:  No results found for: CHOL, TRIG, HDL, CHOLHDL, VLDL, LDLCALC  HgbA1C: No results found for: HGBA1C  Urine Drug Screen:  No  results found for: LABOPIA, COCAINSCRNUR, LABBENZ, AMPHETMU, THCU, LABBARB  Alcohol Level: No results for input(s): ETH in the last 168 hours.  Other results: EKG: normal EKG, normal sinus rhythm, unchanged from previous tracings.  Imaging: Ct Head Wo Contrast  04/15/2015   CLINICAL DATA:  seizure prior to arrival. That lasted about 2 minutes. Full body. Pt post ictal after. Pt alert at this time. History of seizures  EXAM: CT HEAD WITHOUT CONTRAST  TECHNIQUE: Contiguous axial images were obtained from the base of the skull through the vertex without intravenous contrast.  COMPARISON:  12/15/2014  FINDINGS: There is no evidence of acute intracranial hemorrhage, brain edema, mass lesion,  acute infarction, mass effect, or midline shift. Acute infarct may be inapparent on noncontrast CT. Stable small falcine lipoma. No other intra-axial abnormalities are seen, and the ventricles and sulci are within normal limits in size and symmetry. No abnormal extra-axial fluid collections or masses are identified. No significant calvarial abnormality.  IMPRESSION: 1. Negative for bleed or other acute intracranial process.   Electronically Signed   By: Corlis Leak M.D.   On: 04/15/2015 09:16   Dg Chest Port 1 View  04/15/2015   CLINICAL DATA:  Seizure this morning  EXAM: PORTABLE CHEST - 1 VIEW  COMPARISON:  04/06/2015  FINDINGS: Low lung volumes with resultant crowding of perihilar and bibasilar bronchovascular markings. Can't exclude central pulmonary vascular congestion. No pneumothorax. Heart size normal. No effusion. Visualized skeletal structures are unremarkable.  IMPRESSION: 1. Low lung volumes with perihilar bronchovascular crowding.   Electronically Signed   By: Corlis Leak M.D.   On: 04/15/2015 08:34     Assessment/Plan: 54 year old, morbidly obese, Caucasian female with past medical history of known seizure disorder, hypertension, bipolar, schizophrenia, dementia, presented after having an episode generalized  tonic-clonic seizure.   Low VPA level of 24.  S/p 1500 load Restart VPA at 500 TID, s/p discussion with pharmacy D/c today  04/15/2015, 12:36 PM

## 2015-04-15 NOTE — Discharge Instructions (Signed)
Epilepsy  Please increase Depakote dose to 500 mg 3 times a day. It is very important he follow up with neurology or your primary care doctor to have your Depakote level rechecked on Wednesday or Thursday of this week. If she cannot see your regular physician for this, please come to the ER to have a level rechecked.  Continue to take your medications as prescribed with an increasing her Depakote dose as described above. Return to the ER right away if you experience another seizure, headache, weakness, pass out, develop a fever or other new concerns or symptoms arise.  Do not place herself in a place where you could become injured if he were to have another seizure. No driving.  Epilepsy is a disorder in which a person has repeated seizures over time. A seizure is a release of abnormal electrical activity in the brain. Seizures can cause a change in attention, behavior, or the ability to remain awake and alert (altered mental status). Seizures often involve uncontrollable shaking (convulsions).  Most people with epilepsy lead normal lives. However, people with epilepsy are at an increased risk of falls, accidents, and injuries. Therefore, it is important to begin treatment right away. CAUSES  Epilepsy has many possible causes. Anything that disturbs the normal pattern of brain cell activity can lead to seizures. This may include:   Head injury.  Birth trauma.  High fever as a child.  Stroke.  Bleeding into or around the brain.  Certain drugs.  Prolonged low oxygen, such as what occurs after CPR efforts.  Abnormal brain development.  Certain illnesses, such as meningitis, encephalitis (brain infection), malaria, and other infections.  An imbalance of nerve signaling chemicals (neurotransmitters).  SIGNS AND SYMPTOMS  The symptoms of a seizure can vary greatly from one person to another. Right before a seizure, you may have a warning (aura) that a seizure is about to occur. An aura  may include the following symptoms:  Fear or anxiety.  Nausea.  Feeling like the room is spinning (vertigo).  Vision changes, such as seeing flashing lights or spots. Common symptoms during a seizure include:  Abnormal sensations, such as an abnormal smell or a bitter taste in the mouth.   Sudden, general body stiffness.   Convulsions that involve rhythmic jerking of the face, arm, or leg on one or both sides.   Sudden change in consciousness.   Appearing to be awake but not responding.   Appearing to be asleep but cannot be awakened.   Grimacing, chewing, lip smacking, drooling, tongue biting, or loss of bowel or bladder control. After a seizure, you may feel sleepy for a while. DIAGNOSIS  Your health care provider will ask about your symptoms and take a medical history. Descriptions from any witnesses to your seizures will be very helpful in the diagnosis. A physical exam, including a detailed neurological exam, is necessary. Various tests may be done, such as:   An electroencephalogram (EEG). This is a painless test of your brain waves. In this test, a diagram is created of your brain waves. These diagrams can be interpreted by a specialist.  An MRI of the brain.   A CT scan of the brain.   A spinal tap (lumbar puncture, LP).  Blood tests to check for signs of infection or abnormal blood chemistry. TREATMENT  There is no cure for epilepsy, but it is generally treatable. Once epilepsy is diagnosed, it is important to begin treatment as soon as possible. For most people with epilepsy,  seizures can be controlled with medicines. The following may also be used:  A pacemaker for the brain (vagus nerve stimulator) can be used for people with seizures that are not well controlled by medicine.  Surgery on the brain. For some people, epilepsy eventually goes away. HOME CARE INSTRUCTIONS   Follow your health care provider's recommendations on driving and safety in  normal activities.  Get enough rest. Lack of sleep can cause seizures.  Only take over-the-counter or prescription medicines as directed by your health care provider. Take any prescribed medicine exactly as directed.  Avoid any known triggers of your seizures.  Keep a seizure diary. Record what you recall about any seizure, especially any possible trigger.   Make sure the people you live and work with know that you are prone to seizures. They should receive instructions on how to help you. In general, a witness to a seizure should:   Cushion your head and body.   Turn you on your side.   Avoid unnecessarily restraining you.   Not place anything inside your mouth.   Call for emergency medical help if there is any question about what has occurred.   Follow up with your health care provider as directed. You may need regular blood tests to monitor the levels of your medicine.  SEEK MEDICAL CARE IF:   You develop signs of infection or other illness. This might increase the risk of a seizure.   You seem to be having more frequent seizures.   Your seizure pattern is changing.  SEEK IMMEDIATE MEDICAL CARE IF:   You have a seizure that does not stop after a few moments.   You have a seizure that causes any difficulty in breathing.   You have a seizure that results in a very severe headache.   You have a seizure that leaves you with the inability to speak or use a part of your body.  Document Released: 11/24/2005 Document Revised: 09/14/2013 Document Reviewed: 07/06/2013 [facility] Patient Information 2015 [place], [place]. This information is not intended to replace advice given to you by your health care provider. Make sure you discuss any questions you have with your health care provider.

## 2015-04-15 NOTE — ED Provider Notes (Addendum)
[facility] Emergency Department Provider Note  ____________________________________________  Time seen: Approximately 7:53 AM  I have reviewed the triage vital signs and the nursing notes.   HISTORY  Chief Complaint Seizures    HPI ALMEE PELPHREY is a 54 y.o. female who presents after generalized tonic-clonic seizure which lasted approximately 2 minutes. The patient is presently a poor historian, she states she is here because of "seizure", but the patient is unable to provide any further history at this time.  Local history taking is per EMS report is no other caregivers currently here to assist with the patient. Patient is currently living at [facility] family [facility].  Per EMS was generalized seizure lasting approximately 2 minutes was resolved on EMS arrival. Patient was stable with them during transport.  History and review of systems Limited by patient having history of dementia, also appearing to be slightly postictal.   Past Medical History  Diagnosis Date  . Seizures   . Diabetes mellitus without complication     There are no active problems to display for this patient.   History reviewed. No pertinent past surgical history.  Current Outpatient Rx  Name  Route  Sig  Dispense  Refill  . aspirin EC 81 MG tablet   Oral   Take 81 mg by mouth daily.         Marland Kitchen atorvastatin (LIPITOR) 40 MG tablet   Oral   Take 40 mg by mouth at bedtime.         . cloZAPine (CLOZARIL) 100 MG tablet   Oral   Take 50 mg by mouth every evening.         . divalproex (DEPAKOTE) 250 MG DR tablet   Oral   Take 250 mg by mouth daily.          Marland Kitchen donepezil (ARICEPT) 23 MG TABS tablet   Oral   Take 23 mg by mouth at bedtime.         . insulin aspart protamine - aspart (NOVOLOG 70/30 MIX) (70-30) 100 UNIT/ML FlexPen   Subcutaneous   Inject 25 Units into the skin daily.         . Insulin Glargine (LANTUS) 100 UNIT/ML Solostar Pen   Subcutaneous  Inject 15 Units into the skin every evening.         . Linaclotide (LINZESS) 290 MCG CAPS capsule   Oral   Take 290 mcg by mouth daily.         Marland Kitchen lubiprostone (AMITIZA) 24 MCG capsule   Oral   Take 24 mcg by mouth daily.         . magnesium hydroxide (MILK OF MAGNESIA) 400 MG/5ML suspension   Oral   Take 15 mLs by mouth at bedtime. As needed for indigestion, heartburn         . metFORMIN (GLUCOPHAGE) 1000 MG tablet   Oral   Take 1,000 mg by mouth 2 (two) times daily with a meal.         . omeprazole (PRILOSEC) 20 MG capsule   Oral   Take 20 mg by mouth 2 (two) times daily.         Marland Kitchen PARoxetine (PAXIL) 40 MG tablet   Oral   Take 40 mg by mouth daily.         . Potassium Chloride ER 20 MEQ TBCR   Oral   Take 1 tablet by mouth daily.         Marland Kitchen senna-docusate (SENOKOT-S)  8.6-50 MG per tablet   Oral   Take 1 tablet by mouth 2 (two) times daily. As needed for constipation         . torsemide (DEMADEX) 100 MG tablet   Oral   Take 100 mg by mouth every morning.           Allergies Haloperidol and related; Stelazine; Bupropion; Haloperidol lactate; Losartan; Losartan potassium; Thorazine; and Trazodone and nefazodone  History reviewed. No pertinent family history.  Social History History  Substance Use Topics  . Smoking status: Never Smoker   . Smokeless tobacco: Not on file  . Alcohol Use: No    Review of Systems  Limited by patient post-ictal status, unable to provide. Patient denies pain., Patient able to state she is here because of seizure. Otherwise, unable to review of systems the patient.  ____________________________________________   PHYSICAL EXAM:  VITAL SIGNS: ED Triage Vitals  Enc Vitals Group     BP --      Pulse --      Resp --      Temp --      Temp src --      SpO2 04/15/15 0733 95 %     Weight --      Height --      Head Cir --      Peak Flow --      Pain Score --      Pain Loc --      Pain Edu? --      Excl.  in GC? --     Constitutional: Alert and oriented. Well appearing and in no acute distress. Eyes: Conjunctivae are normal. PERRL. extraocular movements show occasional lateral gaze nystagmus. Head: Atraumatic. Nose: No congestion/rhinnorhea. Mouth/Throat: Mucous membranes are moist.  Oropharynx non-erythematous. Neck: No stridor. Cardiovascular: Normal rate, regular rhythm. Grossly normal heart sounds.  Good peripheral circulation. Respiratory: Normal respiratory effort.  No retractions. Lungs CTAB. Gastrointestinal: Soft and nontender. No distention. No abdominal bruits. No CVA tenderness. Musculoskeletal: No lower extremity tenderness nor edema.  No joint effusions. There is moderate stiffness of the muscles of the lower extremities bilaterally, that the patient is able to move them some to commands. There is no clonus. Neurologic: Patient appears confused, very slow to answer any questions, patient is disoriented at this time. Patient is only able to participate in minimal examination. She is able to smile and has symmetric smile, she is able to lift both arms up symmetrically, she is able to move both legs symmetrically, but with generalized 3 out of 5 strength. Skin:  Skin is warm, dry and intact. No rash noted. Psychiatric: Mood is very flat. At this time assessing orientation is difficult, patient is oriented to self only at this time. ____________________________________________   LABS (all labs ordered are listed, but only abnormal results are displayed)  Labs Reviewed  VALPROIC ACID LEVEL - Abnormal; Notable for the following:    Valproic Acid Lvl 24 (*)    All other components within normal limits  BASIC METABOLIC PANEL - Abnormal; Notable for the following:    Creatinine, Ser 1.18 (*)    GFR calc non Af Amer 51 (*)    GFR calc Af Amer 59 (*)    All other components within normal limits  CBC - Abnormal; Notable for the following:    RDW 15.1 (*)    All other components within  normal limits  CBG MONITORING, ED   ____________________________________________  EKG  Date: 04/15/2015  Rate: 90  Rhythm: normal sinus rhythm  QRS Axis: normal  Intervals: normal  ST/T Wave abnormalities: normal  Conduction Disutrbances: none  Narrative Interpretation: unremarkable       ____________________________________________  RADIOLOGY    Discussed with Dr. Loretha BrasilZeylikman. Given patient's difficulty with medication adjustments, recurrent seizures, and episodes of brief hypoxia recommends admission to the hospital for neurology consultation and observation.  ____________________________________________   PROCEDURES  Procedure(s) performed: None  Critical Care performed: No  ____________________________________________   INITIAL IMPRESSION / ASSESSMENT AND PLAN / ED COURSE  Pertinent labs & imaging results that were available during my care of the patient were reviewed by me and considered in my medical decision making (see chart for details).  Patient presents with symptoms that appear quite similar to her last discharge on April 30. Of note patient had it recur and generalized seizure today. Patient continues to appear slightly postictal in the emergency department, per neurology patient's baseline is to be alert and able to answer questions with decent orientation. Currently this is not the case.  In addition patient has brief episodes of hypoxia which seemed to resolve on their own, but her requiring some supplemental oxygen to prevent episodes of hypoxia.  We'll obtain labs including Depakote level, anticipated admission for observation and neurology consultation.  Of note patient does have a long history of seizures, she was last seen for similar in April 29. She had a head CT in January that showed no acute.  ----------------------------------------- 9:38 AM on 04/15/2015 -----------------------------------------  Reevaluation of the patient. Patient  is alert now, she is oriented now, patient is able to give me a history of having a seizure today. She appears much improved. I suspect that the patient had presented with a prolonged postictal state as her mental status now is much improved. We previously considered admission, based on the fact that she has had relatively rapid improvement I will again discuss once labs return with our neurologist recommendation.   ----------------------------------------- 10:43 AM on 04/15/2015 -----------------------------------------  Patient's Depakote level is low, we'll give additional 500 mg by mouth now. Patient's respirations appear normal, she is no longer having any episodes of bradypnea. I suspect that she was having bradypnea secondary to her postictal status. At this point appears much improved. We await consultation from neurology given her complex seizure history.  Insert time stamp  Patient seen by neurology, recommends giving additional 1 g of Depakote now on top of 500 already administered. In addition recommendation to increase Depakote to 500 mg 3 times a day, and follow up with her physician or neurology on Wednesday or Thursday for repeat lab level.  This was discussed with the patient. We will plan to discharge the patient home and she appears quite stable and is no longer having any episodes of bradypnea. She is awake and alert. She is at her mental baseline per her neurologist who knows her.   ____________________________________________   FINAL CLINICAL IMPRESSION(S) / ED DIAGNOSES  Final diagnoses:  Epilepsy, generalized, convulsive      Sharyn CreamerMark Shanika Levings, MD 04/15/15 1127  Sharyn CreamerMark Cliffton Spradley, MD 04/15/15 1128

## 2015-04-15 NOTE — ED Notes (Signed)
CBG 89 

## 2015-04-15 NOTE — ED Notes (Signed)
Nurse speaks with Candice Sawyer from the group home and she is on the way to pick up the patient. Pt given meal tray to eat as she waits. And beverage.

## 2015-04-15 NOTE — ED Notes (Signed)
Patient transported to CT 

## 2015-04-15 NOTE — ED Notes (Signed)
CBG 85

## 2015-04-15 NOTE — ED Notes (Signed)
BIB EMS from care home. Reports seizure prior to arrival. That lasted about 2 minutes. Full body. Pt post ictal after. Pt alert at this time. History of seizures does not know medications.

## 2015-04-15 NOTE — ED Provider Notes (Addendum)
Insert time stamp  Patient remained stable. No acute distress, awaiting ride home back to her group home.  Sharyn CreamerMark Bethania Schlotzhauer, MD 04/15/15 1333  ----------------------------------------- 8:31 AM on 04/20/2015 -----------------------------------------  Patient's caregiver called as they were unable to arrange outpatient Depakote level testing as initially recommended by neurology. I have just spoken with Dr. Leandro ReasonerZack Potter, he will create I gave him patient demographics as well as the phone #[phone] the patient's caretaker, Dr. Malvin JohnsPotter will have his clinic follow-up with the patient to assure the Depakote level can be drawn today.  Sharyn CreamerMark Cashae Weich, MD 04/20/15 [phone]

## 2015-04-15 NOTE — ED Notes (Signed)
Pt assisted on bedside commode. Family at bedside at this time.

## 2015-06-22 ENCOUNTER — Encounter: Payer: Self-pay | Admitting: Emergency Medicine

## 2015-06-22 ENCOUNTER — Emergency Department: Payer: Medicare Other

## 2015-06-22 ENCOUNTER — Inpatient Hospital Stay
Admission: EM | Admit: 2015-06-22 | Discharge: 2015-06-29 | DRG: 193 | Payer: Medicare Other | Attending: Internal Medicine | Admitting: Internal Medicine

## 2015-06-22 DIAGNOSIS — J9601 Acute respiratory failure with hypoxia: Secondary | ICD-10-CM | POA: Diagnosis present

## 2015-06-22 DIAGNOSIS — J189 Pneumonia, unspecified organism: Secondary | ICD-10-CM | POA: Diagnosis not present

## 2015-06-22 DIAGNOSIS — Z7982 Long term (current) use of aspirin: Secondary | ICD-10-CM | POA: Diagnosis not present

## 2015-06-22 DIAGNOSIS — R739 Hyperglycemia, unspecified: Secondary | ICD-10-CM | POA: Diagnosis not present

## 2015-06-22 DIAGNOSIS — K5900 Constipation, unspecified: Secondary | ICD-10-CM | POA: Diagnosis present

## 2015-06-22 DIAGNOSIS — N390 Urinary tract infection, site not specified: Secondary | ICD-10-CM | POA: Diagnosis present

## 2015-06-22 DIAGNOSIS — I5033 Acute on chronic diastolic (congestive) heart failure: Secondary | ICD-10-CM | POA: Diagnosis present

## 2015-06-22 DIAGNOSIS — Z888 Allergy status to other drugs, medicaments and biological substances status: Secondary | ICD-10-CM | POA: Diagnosis not present

## 2015-06-22 DIAGNOSIS — R569 Unspecified convulsions: Secondary | ICD-10-CM | POA: Diagnosis present

## 2015-06-22 DIAGNOSIS — J690 Pneumonitis due to inhalation of food and vomit: Secondary | ICD-10-CM | POA: Diagnosis not present

## 2015-06-22 DIAGNOSIS — F319 Bipolar disorder, unspecified: Secondary | ICD-10-CM | POA: Diagnosis present

## 2015-06-22 DIAGNOSIS — I2510 Atherosclerotic heart disease of native coronary artery without angina pectoris: Secondary | ICD-10-CM | POA: Diagnosis present

## 2015-06-22 DIAGNOSIS — R0600 Dyspnea, unspecified: Secondary | ICD-10-CM

## 2015-06-22 DIAGNOSIS — Z79899 Other long term (current) drug therapy: Secondary | ICD-10-CM

## 2015-06-22 DIAGNOSIS — E669 Obesity, unspecified: Secondary | ICD-10-CM | POA: Diagnosis present

## 2015-06-22 DIAGNOSIS — F0390 Unspecified dementia without behavioral disturbance: Secondary | ICD-10-CM | POA: Diagnosis present

## 2015-06-22 DIAGNOSIS — Z6835 Body mass index (BMI) 35.0-35.9, adult: Secondary | ICD-10-CM | POA: Diagnosis not present

## 2015-06-22 DIAGNOSIS — E1165 Type 2 diabetes mellitus with hyperglycemia: Secondary | ICD-10-CM | POA: Diagnosis present

## 2015-06-22 DIAGNOSIS — R079 Chest pain, unspecified: Secondary | ICD-10-CM

## 2015-06-22 DIAGNOSIS — D638 Anemia in other chronic diseases classified elsewhere: Secondary | ICD-10-CM | POA: Diagnosis present

## 2015-06-22 DIAGNOSIS — F209 Schizophrenia, unspecified: Secondary | ICD-10-CM | POA: Diagnosis present

## 2015-06-22 DIAGNOSIS — R05 Cough: Secondary | ICD-10-CM

## 2015-06-22 DIAGNOSIS — R059 Cough, unspecified: Secondary | ICD-10-CM

## 2015-06-22 DIAGNOSIS — R0602 Shortness of breath: Secondary | ICD-10-CM

## 2015-06-22 DIAGNOSIS — I10 Essential (primary) hypertension: Secondary | ICD-10-CM | POA: Diagnosis present

## 2015-06-22 DIAGNOSIS — R0902 Hypoxemia: Secondary | ICD-10-CM

## 2015-06-22 DIAGNOSIS — J9811 Atelectasis: Secondary | ICD-10-CM | POA: Diagnosis present

## 2015-06-22 HISTORY — DX: Unspecified dementia, unspecified severity, without behavioral disturbance, psychotic disturbance, mood disturbance, and anxiety: F03.90

## 2015-06-22 HISTORY — DX: Atherosclerotic heart disease of native coronary artery without angina pectoris: I25.10

## 2015-06-22 HISTORY — DX: Bipolar disorder, unspecified: F31.9

## 2015-06-22 HISTORY — DX: Constipation, unspecified: K59.00

## 2015-06-22 HISTORY — DX: Schizophrenia, unspecified: F20.9

## 2015-06-22 HISTORY — DX: Essential (primary) hypertension: I10

## 2015-06-22 HISTORY — DX: Heart failure, unspecified: I50.9

## 2015-06-22 HISTORY — DX: Obesity, unspecified: E66.9

## 2015-06-22 LAB — URINALYSIS COMPLETE WITH MICROSCOPIC (ARMC ONLY)
Bilirubin Urine: NEGATIVE
Glucose, UA: NEGATIVE mg/dL
HGB URINE DIPSTICK: NEGATIVE
Ketones, ur: NEGATIVE mg/dL
Nitrite: NEGATIVE
Protein, ur: NEGATIVE mg/dL
SPECIFIC GRAVITY, URINE: 1.006 (ref 1.005–1.030)
pH: 5 (ref 5.0–8.0)

## 2015-06-22 LAB — BASIC METABOLIC PANEL
Anion gap: 15 (ref 5–15)
BUN: 18 mg/dL (ref 6–20)
CALCIUM: 9 mg/dL (ref 8.9–10.3)
CO2: 31 mmol/L (ref 22–32)
CREATININE: 1.21 mg/dL — AB (ref 0.44–1.00)
Chloride: 91 mmol/L — ABNORMAL LOW (ref 101–111)
GFR calc non Af Amer: 50 mL/min — ABNORMAL LOW (ref >60)
GFR, EST AFRICAN AMERICAN: 58 mL/min — AB (ref >60)
Glucose, Bld: 306 mg/dL — ABNORMAL HIGH (ref 65–99)
Potassium: 3.8 mmol/L (ref 3.5–5.1)
Sodium: 137 mmol/L (ref 135–145)

## 2015-06-22 LAB — CBC WITH DIFFERENTIAL/PLATELET
Basophils Absolute: 0 10*3/uL (ref 0–0.1)
Basophils Relative: 0 %
Eosinophils Absolute: 0 10*3/uL (ref 0–0.7)
Eosinophils Relative: 0 %
HEMATOCRIT: 33.3 % — AB (ref 35.0–47.0)
Hemoglobin: 10.7 g/dL — ABNORMAL LOW (ref 12.0–16.0)
LYMPHS PCT: 11 %
Lymphs Abs: 1.5 10*3/uL (ref 1.0–3.6)
MCH: 30.9 pg (ref 26.0–34.0)
MCHC: 32.3 g/dL (ref 32.0–36.0)
MCV: 95.8 fL (ref 80.0–100.0)
Monocytes Absolute: 1 10*3/uL — ABNORMAL HIGH (ref 0.2–0.9)
Monocytes Relative: 8 %
NEUTROS PCT: 81 %
Neutro Abs: 11 10*3/uL — ABNORMAL HIGH (ref 1.4–6.5)
PLATELETS: 269 10*3/uL (ref 150–440)
RBC: 3.47 MIL/uL — AB (ref 3.80–5.20)
RDW: 15.7 % — ABNORMAL HIGH (ref 11.5–14.5)
WBC: 13.6 10*3/uL — AB (ref 3.6–11.0)

## 2015-06-22 LAB — BRAIN NATRIURETIC PEPTIDE: B Natriuretic Peptide: 91 pg/mL (ref 0.0–100.0)

## 2015-06-22 LAB — GLUCOSE, CAPILLARY
GLUCOSE-CAPILLARY: 205 mg/dL — AB (ref 65–99)
Glucose-Capillary: 299 mg/dL — ABNORMAL HIGH (ref 65–99)
Glucose-Capillary: 349 mg/dL — ABNORMAL HIGH (ref 65–99)

## 2015-06-22 MED ORDER — ENOXAPARIN SODIUM 40 MG/0.4ML ~~LOC~~ SOLN
40.0000 mg | SUBCUTANEOUS | Status: DC
  Administered 2015-06-22 – 2015-06-29 (×8): 40 mg via SUBCUTANEOUS
  Filled 2015-06-22 (×8): qty 0.4

## 2015-06-22 MED ORDER — ASPIRIN EC 81 MG PO TBEC
81.0000 mg | DELAYED_RELEASE_TABLET | Freq: Every day | ORAL | Status: DC
  Administered 2015-06-23: 81 mg via ORAL
  Filled 2015-06-22: qty 1

## 2015-06-22 MED ORDER — IPRATROPIUM-ALBUTEROL 0.5-2.5 (3) MG/3ML IN SOLN
3.0000 mL | RESPIRATORY_TRACT | Status: DC
  Administered 2015-06-22 – 2015-06-23 (×7): 3 mL via RESPIRATORY_TRACT
  Filled 2015-06-22 (×7): qty 3

## 2015-06-22 MED ORDER — PAROXETINE HCL 20 MG PO TABS
40.0000 mg | ORAL_TABLET | Freq: Every day | ORAL | Status: DC
  Administered 2015-06-23 – 2015-06-29 (×7): 40 mg via ORAL
  Filled 2015-06-22 (×8): qty 2

## 2015-06-22 MED ORDER — SENNOSIDES-DOCUSATE SODIUM 8.6-50 MG PO TABS: 1.0000 | ORAL_TABLET | Freq: Two times a day (BID) | ORAL | Status: DC | PRN

## 2015-06-22 MED ORDER — GUAIFENESIN-DM 100-10 MG/5ML PO SYRP
5.0000 mL | ORAL_SOLUTION | ORAL | Status: DC | PRN
  Filled 2015-06-22: qty 5

## 2015-06-22 MED ORDER — ASPIRIN EC 81 MG PO TBEC
81.0000 mg | DELAYED_RELEASE_TABLET | Freq: Every day | ORAL | Status: DC
  Administered 2015-06-24 – 2015-06-29 (×6): 81 mg via ORAL
  Filled 2015-06-22 (×7): qty 1

## 2015-06-22 MED ORDER — INSULIN ASPART 100 UNIT/ML ~~LOC~~ SOLN
0.0000 [IU] | Freq: Three times a day (TID) | SUBCUTANEOUS | Status: DC
  Administered 2015-06-22: 11 [IU] via SUBCUTANEOUS
  Administered 2015-06-23 – 2015-06-24 (×3): 3 [IU] via SUBCUTANEOUS
  Administered 2015-06-24: 5 [IU] via SUBCUTANEOUS
  Administered 2015-06-24: 11 [IU] via SUBCUTANEOUS
  Administered 2015-06-25: 3 [IU] via SUBCUTANEOUS
  Administered 2015-06-25: 8 [IU] via SUBCUTANEOUS
  Administered 2015-06-26 (×2): 5 [IU] via SUBCUTANEOUS
  Administered 2015-06-26: 8 [IU] via SUBCUTANEOUS
  Administered 2015-06-27: 15 [IU] via SUBCUTANEOUS
  Administered 2015-06-27: 2 [IU] via SUBCUTANEOUS
  Administered 2015-06-27: 8 [IU] via SUBCUTANEOUS
  Administered 2015-06-28 (×2): 15 [IU] via SUBCUTANEOUS
  Administered 2015-06-28: 3 [IU] via SUBCUTANEOUS
  Administered 2015-06-29: 2 [IU] via SUBCUTANEOUS
  Administered 2015-06-29: 11 [IU] via SUBCUTANEOUS
  Administered 2015-06-29: 5 [IU] via SUBCUTANEOUS
  Filled 2015-06-22: qty 3
  Filled 2015-06-22: qty 2
  Filled 2015-06-22: qty 15
  Filled 2015-06-22 (×2): qty 5
  Filled 2015-06-22: qty 3
  Filled 2015-06-22: qty 11
  Filled 2015-06-22: qty 3
  Filled 2015-06-22: qty 8
  Filled 2015-06-22: qty 3
  Filled 2015-06-22: qty 5
  Filled 2015-06-22: qty 11
  Filled 2015-06-22: qty 3
  Filled 2015-06-22: qty 2
  Filled 2015-06-22: qty 8
  Filled 2015-06-22: qty 5
  Filled 2015-06-22: qty 4
  Filled 2015-06-22: qty 3
  Filled 2015-06-22 (×2): qty 15
  Filled 2015-06-22: qty 8

## 2015-06-22 MED ORDER — LUBIPROSTONE 24 MCG PO CAPS
24.0000 ug | ORAL_CAPSULE | Freq: Every day | ORAL | Status: DC
  Administered 2015-06-23 – 2015-06-29 (×7): 24 ug via ORAL
  Filled 2015-06-22 (×7): qty 1

## 2015-06-22 MED ORDER — ACETAMINOPHEN 650 MG RE SUPP: 650.0000 mg | Freq: Four times a day (QID) | RECTAL | Status: DC | PRN

## 2015-06-22 MED ORDER — ACETAMINOPHEN 325 MG PO TABS: 650.0000 mg | ORAL_TABLET | Freq: Four times a day (QID) | ORAL | Status: DC | PRN

## 2015-06-22 MED ORDER — ONDANSETRON HCL 4 MG/2ML IJ SOLN: 4.0000 mg | Freq: Four times a day (QID) | INTRAMUSCULAR | Status: DC | PRN

## 2015-06-22 MED ORDER — CLOZAPINE 25 MG PO TABS
50.0000 mg | ORAL_TABLET | Freq: Every day | ORAL | Status: DC
  Administered 2015-06-22 – 2015-06-29 (×8): 50 mg via ORAL
  Filled 2015-06-22 (×9): qty 2

## 2015-06-22 MED ORDER — DOCUSATE SODIUM 100 MG PO CAPS
100.0000 mg | ORAL_CAPSULE | Freq: Two times a day (BID) | ORAL | Status: DC
  Administered 2015-06-22 – 2015-06-29 (×16): 100 mg via ORAL
  Filled 2015-06-22 (×16): qty 1

## 2015-06-22 MED ORDER — CLOZAPINE 25 MG PO TABS: 50.0000 mg | ORAL_TABLET | Freq: Two times a day (BID) | ORAL | Status: DC

## 2015-06-22 MED ORDER — LEVOFLOXACIN IN D5W 750 MG/150ML IV SOLN
750.0000 mg | Freq: Once | INTRAVENOUS | Status: AC
  Administered 2015-06-22: 750 mg via INTRAVENOUS
  Filled 2015-06-22: qty 150

## 2015-06-22 MED ORDER — PANTOPRAZOLE SODIUM 40 MG PO TBEC
40.0000 mg | DELAYED_RELEASE_TABLET | Freq: Every day | ORAL | Status: DC
  Administered 2015-06-23 – 2015-06-29 (×7): 40 mg via ORAL
  Filled 2015-06-22 (×8): qty 1

## 2015-06-22 MED ORDER — LINAGLIPTIN 5 MG PO TABS
5.0000 mg | ORAL_TABLET | Freq: Every day | ORAL | Status: DC
  Administered 2015-06-23 – 2015-06-29 (×7): 5 mg via ORAL
  Filled 2015-06-22 (×8): qty 1

## 2015-06-22 MED ORDER — METFORMIN HCL 500 MG PO TABS
1000.0000 mg | ORAL_TABLET | Freq: Two times a day (BID) | ORAL | Status: DC
  Administered 2015-06-22 – 2015-06-26 (×8): 1000 mg via ORAL
  Filled 2015-06-22 (×10): qty 2

## 2015-06-22 MED ORDER — ALBUTEROL SULFATE (2.5 MG/3ML) 0.083% IN NEBU: 2.5000 mg | INHALATION_SOLUTION | RESPIRATORY_TRACT | Status: DC | PRN

## 2015-06-22 MED ORDER — POTASSIUM CHLORIDE CRYS ER 20 MEQ PO TBCR
20.0000 meq | EXTENDED_RELEASE_TABLET | Freq: Every day | ORAL | Status: DC
  Administered 2015-06-23 – 2015-06-29 (×7): 20 meq via ORAL
  Filled 2015-06-22 (×8): qty 1

## 2015-06-22 MED ORDER — CLOZAPINE 25 MG PO TABS
450.0000 mg | ORAL_TABLET | Freq: Every day | ORAL | Status: DC
  Administered 2015-06-22 – 2015-06-29 (×8): 450 mg via ORAL
  Filled 2015-06-22 (×10): qty 2

## 2015-06-22 MED ORDER — SODIUM CHLORIDE 0.9 % IV BOLUS (SEPSIS)
1000.0000 mL | Freq: Once | INTRAVENOUS | Status: AC
  Administered 2015-06-22: 1000 mL via INTRAVENOUS

## 2015-06-22 MED ORDER — TORSEMIDE 100 MG PO TABS
100.0000 mg | ORAL_TABLET | Freq: Every day | ORAL | Status: DC
  Administered 2015-06-23 – 2015-06-29 (×7): 100 mg via ORAL
  Filled 2015-06-22 (×9): qty 1

## 2015-06-22 MED ORDER — LINACLOTIDE 290 MCG PO CAPS
290.0000 ug | ORAL_CAPSULE | ORAL | Status: DC
  Administered 2015-06-25 – 2015-06-29 (×3): 290 ug via ORAL
  Filled 2015-06-22 (×6): qty 1

## 2015-06-22 MED ORDER — ATORVASTATIN CALCIUM 20 MG PO TABS
40.0000 mg | ORAL_TABLET | Freq: Every day | ORAL | Status: DC
  Administered 2015-06-22 – 2015-06-29 (×8): 40 mg via ORAL
  Filled 2015-06-22 (×8): qty 2

## 2015-06-22 MED ORDER — PAROXETINE HCL 20 MG PO TABS: 40.0000 mg | ORAL_TABLET | Freq: Every day | ORAL | Status: DC

## 2015-06-22 MED ORDER — DONEPEZIL HCL 23 MG PO TABS
23.0000 mg | ORAL_TABLET | Freq: Every day | ORAL | Status: DC
  Administered 2015-06-22 – 2015-06-29 (×8): 23 mg via ORAL
  Filled 2015-06-22 (×10): qty 1

## 2015-06-22 MED ORDER — MAGNESIUM HYDROXIDE 400 MG/5ML PO SUSP: 15.0000 mL | Freq: Every day | ORAL | Status: DC | PRN

## 2015-06-22 MED ORDER — INSULIN ASPART 100 UNIT/ML ~~LOC~~ SOLN
0.0000 [IU] | Freq: Every day | SUBCUTANEOUS | Status: DC
  Administered 2015-06-22: 23:00:00 2 [IU] via SUBCUTANEOUS
  Administered 2015-06-24: 22:00:00 4 [IU] via SUBCUTANEOUS
  Administered 2015-06-25: 3 [IU] via SUBCUTANEOUS
  Administered 2015-06-27: 5 [IU] via SUBCUTANEOUS
  Administered 2015-06-29: 2 [IU] via SUBCUTANEOUS
  Filled 2015-06-22: qty 2
  Filled 2015-06-22: qty 5
  Filled 2015-06-22: qty 2
  Filled 2015-06-22: qty 11
  Filled 2015-06-22: qty 3

## 2015-06-22 MED ORDER — DIVALPROEX SODIUM 250 MG PO DR TAB
500.0000 mg | DELAYED_RELEASE_TABLET | Freq: Three times a day (TID) | ORAL | Status: DC
  Administered 2015-06-22 – 2015-06-29 (×23): 500 mg via ORAL
  Filled 2015-06-22 (×23): qty 2

## 2015-06-22 MED ORDER — OXYCODONE HCL 5 MG PO TABS
5.0000 mg | ORAL_TABLET | ORAL | Status: DC | PRN
  Filled 2015-06-22: qty 1

## 2015-06-22 MED ORDER — ONDANSETRON HCL 4 MG PO TABS: 4.0000 mg | ORAL_TABLET | Freq: Four times a day (QID) | ORAL | Status: DC | PRN

## 2015-06-22 NOTE — Plan of Care (Signed)
Problem: Discharge Progression Outcomes Goal: Barriers To Progression Addressed/Resolved Individualization: Pt prefers to be called Candice Sawyer who [facility].  Hx DM, Seizure, CHF controlled by home medications. High Fall Risk w/ understanding how to use call system. Bed alarm on, hourly rounding w/ toileting provided Goal: Other Discharge Outcomes/Goals 1. No c/o pain. Settled into room.  2. Hemodynamically: VSS, afebrile, IV Abx scheduled, sputum culture to be collected 3. Tolerating diet well. Good appetite.  4. Pt remains in bed so far turning independently. Reports using walker at home.  Will continue to assess

## 2015-06-22 NOTE — H&P (Signed)
[facility] -  at [facility]   PATIENT NAME: Candice SoursLinda Romberger    MR#:  [id number]  DATE OF BIRTH:  09/19/1961  DATE OF ADMISSION:  06/22/2015  PRIMARY CARE PHYSICIAN: Derwood KaplanEason,  Ernest B, MD   REQUESTING/REFERRING PHYSICIAN: Sharman CheekPhillip Stafford MD - ED  CHIEF COMPLAINT:   Chief Complaint  Patient presents with  . Hyperglycemia  . Weakness  . Cough    HISTORY OF PRESENT ILLNESS:  Candice Sawyer  is a 54 y.o. female with a known history of CHF, diabetes mellitus, dementia, schizophrenia presents from group home with complaints of weakness, pleuritic chest pain and cough. Some hyperglycemia. Recently started on new diabetes medication by PCP. Patient has schizophrenia and bipolar disorder. Overall poor historian. Does complain of some dry cough along with right central chest pain. Has chronic lower extremity edema. He is unable to tell me when her symptoms started or the progression of disease. Here she has been found to have bibasilar pneumonia along with a urinary tract infection. Saturations on room air 85%. Included his oxygen.  PAST MEDICAL HISTORY:   Past Medical History  Diagnosis Date  . Seizures   . Diabetes mellitus without complication   . CHF (congestive heart failure)   . Bipolar 1 disorder   . Schizophrenia   . Constipation   . CAD (coronary artery disease)   . HTN (hypertension)   . Obesity   . Dementia     PAST SURGICAL HISTORY:  History reviewed. No pertinent past surgical history.  SOCIAL HISTORY:   History  Substance Use Topics  . Smoking status: Never Smoker   . Smokeless tobacco: Not on file  . Alcohol Use: No    FAMILY HISTORY:   Family History  Problem Relation Age of Onset  . Other Other     DRUG ALLERGIES:   Allergies  Allergen Reactions  . Haloperidol And Related Anaphylaxis  . Stelazine [Trifluoperazine] Anaphylaxis  . Bupropion Other (See Comments)    Reaction:  Unknown   . Losartan Potassium Other  (See Comments)    Reaction:  Unknown   . Thorazine [Chlorpromazine] Other (See Comments)    Reaction:  Unknown   . Trazodone And Nefazodone Other (See Comments)    Reaction:  Unknown     REVIEW OF SYSTEMS:   Review of Systems  Constitutional: Positive for malaise/fatigue. Negative for fever, chills and weight loss.  HENT: Negative for hearing loss and nosebleeds.   Eyes: Negative for blurred vision, double vision and pain.  Respiratory: Positive for cough and shortness of breath. Negative for hemoptysis, sputum production and wheezing.   Cardiovascular: Positive for chest pain (on coughing) and leg swelling (chronic). Negative for palpitations and orthopnea.  Gastrointestinal: Negative for nausea, vomiting, abdominal pain, diarrhea and constipation.  Genitourinary: Negative for dysuria and hematuria.  Musculoskeletal: Positive for myalgias. Negative for back pain and falls.  Skin: Negative for rash.  Neurological: Positive for weakness. Negative for dizziness, tremors, sensory change, speech change, focal weakness, seizures and headaches.  Endo/Heme/Allergies: Does not bruise/bleed easily.  Psychiatric/Behavioral: Positive for depression and memory loss. The patient is not nervous/anxious.     MEDICATIONS AT HOME:   Prior to Admission medications   Medication Sig Start Date End Date Taking? Authorizing Provider  acetaminophen (TYLENOL) 500 MG tablet Take 500 mg by mouth every 4 (four) hours as needed for mild pain.   Yes Historical Provider, MD  aspirin EC 81 MG tablet Take 81 mg by mouth daily.  Yes Historical Provider, MD  atorvastatin (LIPITOR) 40 MG tablet Take 40 mg by mouth at bedtime.   Yes Historical Provider, MD  cloZAPine (CLOZARIL) 100 MG tablet Take 50-450 mg by mouth 2 (two) times daily. Pt takes 50mg  at 5pm and 450mg  at bedtime.   Yes Historical Provider, MD  divalproex (DEPAKOTE) 500 MG DR tablet Take 500 mg by mouth 3 (three) times daily.   Yes Historical Provider,  MD  donepezil (ARICEPT) 23 MG TABS tablet Take 23 mg by mouth at bedtime.   Yes Historical Provider, MD  guaiFENesin (ROBITUSSIN) 100 MG/5ML liquid Take 200 mg by mouth 3 (three) times daily as needed for cough.   Yes Historical Provider, MD  Linaclotide (LINZESS) 290 MCG CAPS capsule Take 290 mcg by mouth 3 (three) times a week. Pt uses on Monday, Wednesday, and Friday.   Yes Historical Provider, MD  linagliptin (TRADJENTA) 5 MG TABS tablet Take 5 mg by mouth daily.   Yes Historical Provider, MD  lubiprostone (AMITIZA) 24 MCG capsule Take 24 mcg by mouth daily.   Yes Historical Provider, MD  magnesium hydroxide (MILK OF MAGNESIA) 400 MG/5ML suspension Take 15 mLs by mouth daily as needed for heartburn or indigestion.    Yes Historical Provider, MD  metFORMIN (GLUCOPHAGE) 1000 MG tablet Take 1,000 mg by mouth 2 (two) times daily.    Yes Historical Provider, MD  omeprazole (PRILOSEC) 20 MG capsule Take 20 mg by mouth 2 (two) times daily.   Yes Historical Provider, MD  PARoxetine (PAXIL) 40 MG tablet Take 40 mg by mouth daily.   Yes Historical Provider, MD  potassium chloride SA (K-DUR,KLOR-CON) 20 MEQ tablet Take 20 mEq by mouth daily.   Yes Historical Provider, MD  senna-docusate (SENOKOT-S) 8.6-50 MG per tablet Take 1 tablet by mouth 2 (two) times daily as needed for mild constipation.    Yes Historical Provider, MD  torsemide (DEMADEX) 100 MG tablet Take 100 mg by mouth daily.    Yes Historical Provider, MD  divalproex (DEPAKOTE) 250 MG DR tablet Take 2 tablets (500 mg total) by mouth 3 (three) times daily. Patient not taking: Reported on 06/22/2015 04/15/15   Sharyn Creamer, MD      VITAL SIGNS:  Blood pressure 138/63, pulse 95, temperature 98.5 F (36.9 C), temperature source Oral, resp. rate 20, height 5\' 2"  (1.575 m), weight 84.823 kg (187 lb), SpO2 93 %.  PHYSICAL EXAMINATION:  Physical Exam  GENERAL:  54 y.o.-year-old patient lying in the bed with no acute distress.  EYES: Pupils equal,  round, reactive to light and accommodation. No scleral icterus. Extraocular muscles intact.  HEENT: Head atraumatic, normocephalic. Oropharynx and nasopharynx clear. No oropharyngeal erythema, moist oral mucosa  NECK:  Supple, no jugular venous distention. No thyroid enlargement, no tenderness.  LUNGS: Normal breath sounds bilaterally, no wheezing, rales, rhonchi. No use of accessory muscles of respiration.  CARDIOVASCULAR: S1, S2 normal. No murmurs, rubs, or gallops.  ABDOMEN: Soft, nontender, nondistended. Bowel sounds present. No organomegaly or mass.  EXTREMITIES: No pedal edema, cyanosis, or clubbing. + 2 pedal & radial pulses b/l.   NEUROLOGIC: Cranial nerves II through XII are intact. No focal Motor or sensory deficits appreciated b/l PSYCHIATRIC: The patient is alert and oriented x 3. Good affect.  SKIN: No obvious rash, lesion, or ulcer.   LABORATORY PANEL:   CBC  Recent Labs Lab 06/22/15 1054  WBC 13.6*  HGB 10.7*  HCT 33.3*  PLT 269   ------------------------------------------------------------------------------------------------------------------  Chemistries  No  results for input(s): NA, K, CL, CO2, GLUCOSE, BUN, CREATININE, CALCIUM, MG, AST, ALT, ALKPHOS, BILITOT in the last 168 hours.  Invalid input(s): GFRCGP ------------------------------------------------------------------------------------------------------------------  Cardiac Enzymes No results for input(s): TROPONINI in the last 168 hours. ------------------------------------------------------------------------------------------------------------------  RADIOLOGY:  Dg Chest 2 View  06/22/2015   CLINICAL DATA:  Cough. High blood sugar for 1 week. Cough for 2 days. Weakness.  EXAM: CHEST  2 VIEW  COMPARISON:  04/15/2015.  Multiple priors dating back to 08/26/2013.  FINDINGS: There are chronically low lung volumes in the chest. RIGHT middle lobe atelectasis is evident on the lateral view. Subsegmental  atelectasis or scarring in the RIGHT midlung. No definite airspace disease. LEFT lower lobe atelectasis is also present.  IMPRESSION: Low volume chest with scattered areas of subsegmental atelectasis and RIGHT middle lobe atelectasis.   Electronically Signed   By: Andreas Newport M.D.   On: 06/22/2015 09:59     IMPRESSION AND PLAN:   31 f with DM, CHF, Schizoaffective disorder here from group home due to cough, hyperglycemia  * Bibasilar pneumonia Start on IV Levaquin. Send sputum cultures. Daily nebulizers.  * Acute respiratory failure, Hypoxic Oxygen to keep saturations greater than 92%  * UTI On IV antibiotics. Await for cultures.  * DM, Uncontrolled Has had elevated blood sugars over the last few days. Recently started on a new medication by PCP. Place patient on sliding scale insulin along with home medications. Add medications as needed.  * CHF Diastolic vs systolic On torsemide at home. BNP and normal here.  * Seizures On depakote    All the records are reviewed and case discussed with ED provider. Management plans discussed with the patient, family and they are in agreement.  CODE STATUS: FULL  TOTAL TIME TAKING CARE OF THIS PATIENT: 40 minutes.    Milagros Loll R M.D on 06/22/2015 at 2:01 PM  Between 7am to 6pm - Pager - [phone]  After 6pm go to [email or web address] - password [email or web address] [facility]  [facility]  Office  [phone]  CC: Primary care physician; Derwood Kaplan, MD

## 2015-06-22 NOTE — ED Provider Notes (Signed)
[facility] Emergency Department Provider Note  ____________________________________________  Time seen: 11:00 AM  I have reviewed the triage vital signs and the nursing notes.   HISTORY  Chief Complaint Hyperglycemia; Weakness; and Cough    HPI Candice Sawyer is a 54 y.o. female who complains of productive cough 2 days with generalized weakness. She's had trouble ambulating due to severe shortness of breath. She's also had persistently elevated blood sugars over the past week, and her PCP started her on a new hypoglycemic medication due to the elevated blood sugar. No fevers or chills. She does have a history of pneumonia about a year ago that was severe and caused the patient to be intubated on mechanical ventilation. She reports that this feels similar to prior pneumonia     Past Medical History  Diagnosis Date  . Seizures   . Diabetes mellitus without complication   . CHF (congestive heart failure)     There are no active problems to display for this patient.   History reviewed. No pertinent past surgical history.  Current Outpatient Rx  Name  Route  Sig  Dispense  Refill  . aspirin EC 81 MG tablet   Oral   Take 81 mg by mouth daily.         Marland Kitchen. atorvastatin (LIPITOR) 40 MG tablet   Oral   Take 40 mg by mouth at bedtime.         . cloZAPine (CLOZARIL) 100 MG tablet   Oral   Take 50 mg by mouth every evening.         . divalproex (DEPAKOTE) 250 MG DR tablet   Oral   Take 2 tablets (500 mg total) by mouth 3 (three) times daily.   21 tablet   0   . donepezil (ARICEPT) 23 MG TABS tablet   Oral   Take 23 mg by mouth at bedtime.         . insulin aspart protamine - aspart (NOVOLOG 70/30 MIX) (70-30) 100 UNIT/ML FlexPen   Subcutaneous   Inject 25 Units into the skin daily.         . Insulin Glargine (LANTUS) 100 UNIT/ML Solostar Pen   Subcutaneous   Inject 15 Units into the skin every evening.         . Linaclotide  (LINZESS) 290 MCG CAPS capsule   Oral   Take 290 mcg by mouth daily.         Marland Kitchen. lubiprostone (AMITIZA) 24 MCG capsule   Oral   Take 24 mcg by mouth daily.         . magnesium hydroxide (MILK OF MAGNESIA) 400 MG/5ML suspension   Oral   Take 15 mLs by mouth at bedtime. As needed for indigestion, heartburn         . metFORMIN (GLUCOPHAGE) 1000 MG tablet   Oral   Take 1,000 mg by mouth 2 (two) times daily with a meal.         . omeprazole (PRILOSEC) 20 MG capsule   Oral   Take 20 mg by mouth 2 (two) times daily.         Marland Kitchen. PARoxetine (PAXIL) 40 MG tablet   Oral   Take 40 mg by mouth daily.         . Potassium Chloride ER 20 MEQ TBCR   Oral   Take 1 tablet by mouth daily.         Marland Kitchen. senna-docusate (SENOKOT-S) 8.6-50 MG per tablet  Oral   Take 1 tablet by mouth 2 (two) times daily. As needed for constipation         . torsemide (DEMADEX) 100 MG tablet   Oral   Take 100 mg by mouth every morning.           Allergies Haloperidol and related; Stelazine; Bupropion; Haloperidol lactate; Losartan; Losartan potassium; Thorazine; and Trazodone and nefazodone  No family history on file.  Social History History  Substance Use Topics  . Smoking status: Never Smoker   . Smokeless tobacco: Not on file  . Alcohol Use: No    Review of Systems  Constitutional: No fever or chills. No weight changes Eyes:No blurry vision or double vision.  ENT: No sore throat. Cardiovascular: No chest pain. Respiratory: Positive dyspnea with productive cough. Gastrointestinal: Negative for abdominal pain, vomiting and diarrhea.  No BRBPR or melena. Genitourinary: Negative for dysuria, urinary retention, bloody urine, or difficulty urinating. Musculoskeletal: Negative for back pain. No joint swelling or pain. Skin: Negative for rash. Neurological: Negative for headaches, focal weakness or numbness. Psychiatric:No anxiety or depression.   Endocrine:No hot/cold intolerance,  changes in energy, or sleep difficulty.  10-point ROS otherwise negative.  ____________________________________________   PHYSICAL EXAM:  VITAL SIGNS: ED Triage Vitals  Enc Vitals Group     BP 06/22/15 0916 136/46 mmHg     Pulse Rate 06/22/15 0916 98     Resp 06/22/15 0916 20     Temp 06/22/15 0916 98.5 F (36.9 C)     Temp Source 06/22/15 0916 Oral     SpO2 06/22/15 0916 93 %     Weight 06/22/15 0916 187 lb (84.823 kg)     Height 06/22/15 0916 5\' 2"  (1.575 m)     Head Cir --      Peak Flow --      Pain Score 06/22/15 0916 10     Pain Loc --      Pain Edu? --      Excl. in GC? --    Oxygen saturation 85% on room air, increased to 95% on 3 L nasal cannula  Constitutional: Alert and oriented. Well appearing and in mild respiratory distress. Eyes: No scleral icterus. No conjunctival pallor. PERRL. EOMI ENT   Head: Normocephalic and atraumatic.   Nose: No congestion/rhinnorhea. No septal hematoma   Mouth/Throat: MMM, no pharyngeal erythema. No peritonsillar mass. No uvula shift.   Neck: No stridor. No SubQ emphysema. No meningismus. Hematological/Lymphatic/Immunilogical: No cervical lymphadenopathy. Cardiovascular: RRR. Normal and symmetric distal pulses are present in all extremities. No murmurs, rubs, or gallops. Respiratory: Diffuse rhonchi with mild expiratory wheezing. Tachypnea. Gastrointestinal: Soft and nontender. No distention. There is no CVA tenderness.  No rebound, rigidity, or guarding. Genitourinary: deferred Musculoskeletal: Nontender with normal range of motion in all extremities. No joint effusions.  No lower extremity tenderness.  No edema. Neurologic:   Normal speech and language.  CN 2-10 normal. Motor grossly intact. No pronator drift.  Normal gait. No gross focal neurologic deficits are appreciated.  Skin:  Skin is warm, dry and intact. No rash noted.  No petechiae, purpura, or bullae. Psychiatric: Mood and affect are normal. Speech and  behavior are normal. Patient exhibits appropriate insight and judgment.  ____________________________________________    LABS (pertinent positives/negatives) (all labs ordered are listed, but only abnormal results are displayed) Labs Reviewed  GLUCOSE, CAPILLARY - Abnormal; Notable for the following:    Glucose-Capillary 299 (*)    All other components within normal limits  URINALYSIS COMPLETEWITH MICROSCOPIC ([facility] ONLY) - Abnormal; Notable for the following:    Color, Urine COLORLESS (*)    APPearance HAZY (*)    Leukocytes, UA TRACE (*)    Bacteria, UA RARE (*)    Squamous Epithelial / LPF 0-5 (*)    All other components within normal limits  CBC WITH DIFFERENTIAL/PLATELET - Abnormal; Notable for the following:    WBC 13.6 (*)    RBC 3.47 (*)    Hemoglobin 10.7 (*)    HCT 33.3 (*)    RDW 15.7 (*)    Neutro Abs 11.0 (*)    Monocytes Absolute 1.0 (*)    All other components within normal limits  BRAIN NATRIURETIC PEPTIDE  CBG MONITORING, ED   ____________________________________________   EKG  Interpreted by me Normal sinus rhythm rate of 98, normal axis. Short P-R with a PR interval of 106 MS, normal QRS, ST segments and T waves.  ____________________________________________    RADIOLOGY  Chest x-ray report by radiology reveals multiple foci of atelectasis and prominent right middle lobe atelectasis. I interpreted this x-ray myself, and on my read in the patient's clinical contacts, it is significant for a right middle lobe opacity concerning for consolidation and pneumonia.  ____________________________________________   PROCEDURES  ____________________________________________   INITIAL IMPRESSION / ASSESSMENT AND PLAN / ED COURSE  Pertinent labs & imaging results that were available during my care of the patient were reviewed by me and considered in my medical decision making (see chart for details).  Patient presents with acute respiratory failure with  hypoxia, lung exam and x-ray concerning for pneumonia. She has a history of pneumonia with a very severe course. She is diabetic as well and at high risk for decompensation. We'll start her on IV Levaquin and fluids and plan to admit her to the hospital for further monitoring.  ____________________________________________   FINAL CLINICAL IMPRESSION(S) / ED DIAGNOSES  Final diagnoses:  Community acquired pneumonia  Acute respiratory failure with hypoxia      Sharman Cheek, MD 06/22/15 1316

## 2015-06-22 NOTE — ED Notes (Signed)
Pt's caregiver reports high blood sugars x1 week; reports cough x2 days and weakness. Caregiver reports pt was unable to get out of car at PCP's office, no difficulties with this RN helping pt out of car. Pt's CBG is 299.

## 2015-06-22 NOTE — ED Notes (Signed)
Sister, Steward DroneBrenda, left number to be reached: [phone]

## 2015-06-23 LAB — URINE CULTURE

## 2015-06-23 LAB — GLUCOSE, CAPILLARY
GLUCOSE-CAPILLARY: 168 mg/dL — AB (ref 65–99)
GLUCOSE-CAPILLARY: 131 mg/dL — AB (ref 65–99)
Glucose-Capillary: 173 mg/dL — ABNORMAL HIGH (ref 65–99)
Glucose-Capillary: 174 mg/dL — ABNORMAL HIGH (ref 65–99)

## 2015-06-23 MED ORDER — IPRATROPIUM-ALBUTEROL 0.5-2.5 (3) MG/3ML IN SOLN
3.0000 mL | Freq: Four times a day (QID) | RESPIRATORY_TRACT | Status: DC
  Administered 2015-06-24 (×2): 3 mL via RESPIRATORY_TRACT
  Filled 2015-06-23 (×2): qty 3

## 2015-06-23 MED ORDER — ALBUTEROL SULFATE (2.5 MG/3ML) 0.083% IN NEBU
2.5000 mg | INHALATION_SOLUTION | RESPIRATORY_TRACT | Status: DC | PRN
  Administered 2015-06-29: 2.5 mg via RESPIRATORY_TRACT

## 2015-06-23 NOTE — Progress Notes (Signed)
MEDICATION RELATED CONSULT NOTE - INITIAL   Pharmacy Consult for Clozapine Monitoring   Allergies  Allergen Reactions  . Haloperidol And Related Anaphylaxis  . Stelazine [Trifluoperazine] Anaphylaxis  . Bupropion Other (See Comments)    Reaction:  Unknown   . Losartan Potassium Other (See Comments)    Reaction:  Unknown   . Thorazine [Chlorpromazine] Other (See Comments)    Reaction:  Unknown   . Trazodone And Nefazodone Other (See Comments)    Reaction:  Unknown     Patient Measurements: Height: 5' (152.4 cm) Weight: 184 lb (83.462 kg) IBW/kg (Calculated) : 45.5   Vital Signs: Temp: 98.7 F (37.1 C) (07/15 2142) Temp Source: Oral (07/15 2142) BP: 108/49 mmHg (07/15 2145) Pulse Rate: 91 (07/15 2145) Intake/Output from previous day:   Intake/Output from this shift:    Labs:  Recent Labs  06/22/15 1054  WBC 13.6*  HGB 10.7*  HCT 33.3*  PLT 269  CREATININE 1.21*   Estimated Creatinine Clearance: 50.9 mL/min (by C-G formula based on Cr of 1.21).   Microbiology: No results found for this or any previous visit (from the past 720 hour(s)).  Medical History: Past Medical History  Diagnosis Date  . Seizures   . Diabetes mellitus without complication   . CHF (congestive heart failure)   . Bipolar 1 disorder   . Schizophrenia   . Constipation   . CAD (coronary artery disease)   . HTN (hypertension)   . Obesity   . Dementia     Medications:  Scheduled:  . aspirin EC  81 mg Oral Daily  . aspirin EC  81 mg Oral Daily  . atorvastatin  40 mg Oral QHS  . cloZAPine  450 mg Oral QHS  . cloZAPine  50 mg Oral QAC supper  . divalproex  500 mg Oral TID  . docusate sodium  100 mg Oral BID  . donepezil  23 mg Oral QHS  . enoxaparin (LOVENOX) injection  40 mg Subcutaneous Q24H  . insulin aspart  0-15 Units Subcutaneous TID WC  . insulin aspart  0-5 Units Subcutaneous QHS  . ipratropium-albuterol  3 mL Nebulization Q4H  . Linaclotide  290 mcg Oral Once per day  on Mon Wed Fri  . linagliptin  5 mg Oral Daily  . lubiprostone  24 mcg Oral Daily  . metFORMIN  1,000 mg Oral BID WC  . pantoprazole  40 mg Oral Daily  . PARoxetine  40 mg Oral Daily  . potassium chloride SA  20 mEq Oral Daily  . torsemide  100 mg Oral Daily    Assessment: Patient is a 54 yo female admitted for treatment of pneumonia.  Patient takes clozapine 450 mg po qhs and 50 mg po daily as an outpatient.  This medication was continued inhouse.  Pharmacy to monitor ANC.   7/15: ANC=11.0   Lab value reported to [facility] registry.  Goal of Therapy:  Monitor ANC every  4 weeks per Clozapine REMS registry  Plan:  Continue to follow and order follow up ANC/WBC in 4 weeks if patient is still admitted to hospital.   Jeri Rawlins G 06/23/2015,2:07 AM

## 2015-06-23 NOTE — Evaluation (Signed)
Physical Therapy Evaluation Patient Details Name: Candice Sawyer MRN: [id number] DOB: 10/10/1961 Today's Date: 06/23/2015   History of Present Illness  Patient is a 54 y/o female that presents from group home with pneumonia and shortness of breath. She has a past medical history of schizophrenia, dementia, DM2, CHF.  Clinical Impression  Patient is a 54 y/o female that presents from a group home with pneumonia and shortness of breath. She has a past medical history of dementia and schizophrenia and is a poor historian it appears during this evaluation. Patient erratically transfers supine to sit, jerking her body in doing so. Upon sitting patient had urinated over herself and the bed, though she informed PT this was not new for her. Upon sitting patient leaned to her left side and when asked to touch her left hand to her head, lost her balance to the left. She did not display left sided neglect, but upon standing again erratically jerked her body to the left and posteriorly. She displayed appropriate hand placement on the RW with her RUE, but LUE required multiple attempts at cuing for proper placement. With +2 assistance PT could stand patient, and she takes short shuffling steps to transfer bed to chair with RW and mod A x1. Patient attempted to sit prior to the chair, and generally has poor awareness of her mobility deficits. She was able to complete finger-nose-finger test, though tremors noted throughout. There is some concern of recent neurologic change given her consistent left sided deficits and apparently significant decline in mobility. RN notified. Skilled acute PT services are indicated to address the above deficits.     Follow Up Recommendations SNF    Equipment Recommendations       Recommendations for Other Services       Precautions / Restrictions Precautions Precautions: Fall Restrictions Weight Bearing Restrictions: No      Mobility  Bed Mobility Overal bed mobility:  Needs Assistance Bed Mobility: Supine to Sit     Supine to sit: Min guard        Transfers Overall transfer level: Needs assistance Equipment used: Rolling walker (2 wheeled) Transfers: Sit to/from Stand Sit to Stand: +2 physical assistance;Mod assist            Ambulation/Gait Ambulation/Gait assistance: Mod assist Ambulation Distance (Feet): 5 Feet Assistive device: Rolling walker (2 wheeled) Gait Pattern/deviations: Decreased step length - right;Decreased step length - left;Shuffle;Decreased stride length   Gait velocity interpretation: Below normal speed for age/gender    Stairs            Wheelchair Mobility    Modified Rankin (Stroke Patients Only)       Balance Overall balance assessment: Needs assistance Sitting-balance support: Bilateral upper extremity supported Sitting balance-Leahy Scale: Fair Sitting balance - Comments: Patient loses her balance to the left in sitting when she is asked to bring her LUE overhead.  Postural control: Left lateral lean   Standing balance-Leahy Scale: Poor Standing balance comment: Patient requires mod A x 1 and wheeled walker to transfer from bed to chair with poor sequencing and poor hand placement on RW, patient unable to follow cues provided for hand placement and sequencing.                              Pertinent Vitals/Pain Pain Assessment:  (Patient does not express any pain or make any faces indicative of pain. )    Home Living Family/patient expects to  be discharged to:: Group home Living Arrangements: Group Home Available Help at Discharge: Available 24 hours/day (Per patient 24/7 care. ) Type of Home: Group Home Home Access: Level entry     Home Layout: One level Home Equipment: Walker - 2 wheels      Prior Function Level of Independence: Independent with assistive device(s)         Comments: Per the patient (who is a poor historian) she had previously been independent with a RW  for household distances.      Hand Dominance        Extremity/Trunk Assessment   Upper Extremity Assessment: Overall WFL for tasks assessed           Lower Extremity Assessment: Overall WFL for tasks assessed         Communication   Communication: Expressive difficulties (Patient has multiple chronic mental health disorders, and is a poor historian. She has trouble expressing her answers to questions aside from "yes" and "no". )  Cognition Arousal/Alertness: Awake/alert Behavior During Therapy: Flat affect;WFL for tasks assessed/performed Overall Cognitive Status: History of cognitive impairments - at baseline       Memory: Decreased short-term memory              General Comments      Exercises        Assessment/Plan    PT Assessment Patient needs continued PT services  PT Diagnosis Difficulty walking;Generalized weakness;Abnormality of gait   PT Problem List Decreased strength;Decreased cognition;Decreased activity tolerance;Decreased knowledge of use of DME;Decreased safety awareness;Decreased balance;Decreased mobility  PT Treatment Interventions DME instruction;Gait training;Balance training;Therapeutic activities;Therapeutic exercise   PT Goals (Current goals can be found in the Care Plan section) Acute Rehab PT Goals Patient Stated Goal: Patient was not able to participate.  PT Goal Formulation: Patient unable to participate in goal setting Time For Goal Achievement: 07/07/15 Potential to Achieve Goals: Fair    Frequency Min 2X/week   Barriers to discharge Decreased caregiver support PT is unaware of how much assistance is provided by her group home, and at this time patient would need significant physical assistance to return to her previous living facility.     Co-evaluation               End of Session Equipment Utilized During Treatment: Gait belt Activity Tolerance: Patient tolerated treatment well Patient left: in chair;with call  bell/phone within reach;with chair alarm set Nurse Communication: Mobility status (Concerns of neurologic deficit. )         Time: [id number] PT Time Calculation (min) (ACUTE ONLY): 22 min   Charges:   PT Evaluation $Initial PT Evaluation Tier I: 1 Procedure     PT G Codes:       Kerin RansomPatrick A Faythe Heitzenrater, PT, DPT    06/23/2015, 2:40 PM

## 2015-06-23 NOTE — Plan of Care (Signed)
Problem: Discharge Progression Outcomes Goal: Other Discharge Outcomes/Goals Plan of Care Progress to Goal:   Pt O2 dropped to 89% during shift. Pt denies pain. Pt has been awake each time someone round on her. No other signs of distress noted. Will continue to monitor.

## 2015-06-24 ENCOUNTER — Inpatient Hospital Stay: Payer: Medicare Other

## 2015-06-24 LAB — TROPONIN I: Troponin I: <0.03 ng/mL (ref <0.031)

## 2015-06-24 LAB — CBC WITH DIFFERENTIAL/PLATELET
Basophils Absolute: 0 10*3/uL (ref 0–0.1)
Basophils Relative: 0 %
EOS ABS: 0 10*3/uL (ref 0–0.7)
Eosinophils Relative: 0 %
HCT: 29.9 % — ABNORMAL LOW (ref 35.0–47.0)
Hemoglobin: 9.9 g/dL — ABNORMAL LOW (ref 12.0–16.0)
Lymphocytes Relative: 13 %
Lymphs Abs: 1 10*3/uL (ref 1.0–3.6)
MCH: 31.4 pg (ref 26.0–34.0)
MCHC: 33 g/dL (ref 32.0–36.0)
MCV: 95.2 fL (ref 80.0–100.0)
MONOS PCT: 9 %
Monocytes Absolute: 0.7 10*3/uL (ref 0.2–0.9)
NEUTROS ABS: 6 10*3/uL (ref 1.4–6.5)
NEUTROS PCT: 78 %
PLATELETS: 230 10*3/uL (ref 150–440)
RBC: 3.15 MIL/uL — AB (ref 3.80–5.20)
RDW: 15.8 % — ABNORMAL HIGH (ref 11.5–14.5)
WBC: 7.7 10*3/uL (ref 3.6–11.0)

## 2015-06-24 LAB — BASIC METABOLIC PANEL
ANION GAP: 13 (ref 5–15)
BUN: 14 mg/dL (ref 6–20)
CALCIUM: 8.4 mg/dL — AB (ref 8.9–10.3)
CO2: 33 mmol/L — ABNORMAL HIGH (ref 22–32)
Chloride: 92 mmol/L — ABNORMAL LOW (ref 101–111)
Creatinine, Ser: 1.15 mg/dL — ABNORMAL HIGH (ref 0.44–1.00)
GFR calc Af Amer: >60 mL/min (ref >60)
GFR, EST NON AFRICAN AMERICAN: 53 mL/min — AB (ref >60)
Glucose, Bld: 198 mg/dL — ABNORMAL HIGH (ref 65–99)
Potassium: 3.7 mmol/L (ref 3.5–5.1)
SODIUM: 138 mmol/L (ref 135–145)

## 2015-06-24 LAB — GLUCOSE, CAPILLARY
GLUCOSE-CAPILLARY: 333 mg/dL — AB (ref 65–99)
GLUCOSE-CAPILLARY: 339 mg/dL — AB (ref 65–99)
Glucose-Capillary: 178 mg/dL — ABNORMAL HIGH (ref 65–99)
Glucose-Capillary: 188 mg/dL — ABNORMAL HIGH (ref 65–99)
Glucose-Capillary: 216 mg/dL — ABNORMAL HIGH (ref 65–99)

## 2015-06-24 MED ORDER — POTASSIUM CHLORIDE CRYS ER 20 MEQ PO TBCR
40.0000 meq | EXTENDED_RELEASE_TABLET | Freq: Once | ORAL | Status: AC
  Administered 2015-06-24: 12:00:00 40 meq via ORAL
  Filled 2015-06-24: qty 2

## 2015-06-24 MED ORDER — IPRATROPIUM-ALBUTEROL 0.5-2.5 (3) MG/3ML IN SOLN
3.0000 mL | Freq: Three times a day (TID) | RESPIRATORY_TRACT | Status: DC
  Administered 2015-06-24 – 2015-06-26 (×5): 3 mL via RESPIRATORY_TRACT
  Filled 2015-06-24 (×5): qty 3

## 2015-06-24 MED ORDER — FUROSEMIDE 20 MG PO TABS: 40.0000 mg | ORAL_TABLET | Freq: Once | ORAL | Status: DC

## 2015-06-24 MED ORDER — FUROSEMIDE 10 MG/ML IJ SOLN
20.0000 mg | Freq: Once | INTRAMUSCULAR | Status: AC
  Administered 2015-06-24: 11:00:00 20 mg via INTRAVENOUS
  Filled 2015-06-24: qty 2

## 2015-06-24 MED ORDER — NITROGLYCERIN 2 % TD OINT
0.5000 [in_us] | TOPICAL_OINTMENT | Freq: Four times a day (QID) | TRANSDERMAL | Status: DC
  Administered 2015-06-24 – 2015-06-26 (×10): 0.5 [in_us] via TOPICAL
  Filled 2015-06-24 (×10): qty 1

## 2015-06-24 MED ORDER — FUROSEMIDE 10 MG/ML IJ SOLN
INTRAMUSCULAR | Status: AC
  Administered 2015-06-24: 40 mg
  Filled 2015-06-24: qty 4

## 2015-06-24 MED ORDER — METHYLPREDNISOLONE SODIUM SUCC 125 MG IJ SOLR
60.0000 mg | Freq: Every day | INTRAMUSCULAR | Status: DC
  Administered 2015-06-24 – 2015-06-25 (×2): 60 mg via INTRAVENOUS
  Filled 2015-06-24 (×2): qty 2

## 2015-06-24 MED ORDER — FUROSEMIDE 10 MG/ML IJ SOLN
40.0000 mg | Freq: Once | INTRAMUSCULAR | Status: DC
  Filled 2015-06-24: qty 4

## 2015-06-24 NOTE — Progress Notes (Signed)
[facility] -  at [facility]   PATIENT NAME: Candice Sawyer    MR#:  [id number]  DATE OF BIRTH:  17-Sep-1961  SUBJECTIVE:  CHIEF COMPLAINT:   Chief Complaint  Patient presents with  . Hyperglycemia  . Weakness  . Cough   SOB better. Dry cough.  REVIEW OF SYSTEMS:    ROS Unobtainable Dementia   DRUG ALLERGIES:   Allergies  Allergen Reactions  . Haloperidol And Related Anaphylaxis  . Stelazine [Trifluoperazine] Anaphylaxis  . Bupropion Other (See Comments)    Reaction:  Unknown   . Losartan Potassium Other (See Comments)    Reaction:  Unknown   . Thorazine [Chlorpromazine] Other (See Comments)    Reaction:  Unknown   . Trazodone And Nefazodone Other (See Comments)    Reaction:  Unknown     VITALS:  Blood pressure 112/54, pulse 80, temperature 98.7 F (37.1 C), temperature source Oral, resp. rate 20, height 5' (1.524 m), weight 82.419 kg (181 lb 11.2 oz), SpO2 93 %.  PHYSICAL EXAMINATION:   Physical Exam  GENERAL:  54 y.o.-year-old patient lying in the bed with no acute distress.  EYES: Pupils equal, round, reactive to light and accommodation. No scleral icterus. Extraocular muscles intact.  HEENT: Head atraumatic, normocephalic. Oropharynx and nasopharynx clear.  NECK:  Supple, no jugular venous distention. No thyroid enlargement, no tenderness.  LUNGS: Bilateral coarse breath sounds. Normal work of breathing. CARDIOVASCULAR: S1, S2 normal. No murmurs, rubs, or gallops.  ABDOMEN: Soft, nontender, nondistended. Bowel sounds present. No organomegaly or mass.  EXTREMITIES: No cyanosis, clubbing or edema b/l.    NEUROLOGIC: Cranial nerves II through XII are intact. No focal Motor or sensory deficits b/l.   PSYCHIATRIC: The patient is alert and awake. Flat affect. SKIN: No obvious rash, lesion, or ulcer.    LABORATORY PANEL:   CBC  Recent Labs Lab 06/22/15 1054  WBC 13.6*  HGB 10.7*  HCT 33.3*  PLT 269    ------------------------------------------------------------------------------------------------------------------  Chemistries   Recent Labs Lab 06/22/15 1054  NA 137  K 3.8  CL 91*  CO2 31  GLUCOSE 306*  BUN 18  CREATININE 1.21*  CALCIUM 9.0   ------------------------------------------------------------------------------------------------------------------  Cardiac Enzymes  Recent Labs Lab 06/24/15 0637  TROPONINI <0.03   ------------------------------------------------------------------------------------------------------------------  RADIOLOGY:  Dg Chest 2 View  06/22/2015   CLINICAL DATA:  Cough. High blood sugar for 1 week. Cough for 2 days. Weakness.  EXAM: CHEST  2 VIEW  COMPARISON:  04/15/2015.  Multiple priors dating back to 08/26/2013.  FINDINGS: There are chronically low lung volumes in the chest. RIGHT middle lobe atelectasis is evident on the lateral view. Subsegmental atelectasis or scarring in the RIGHT midlung. No definite airspace disease. LEFT lower lobe atelectasis is also present.  IMPRESSION: Low volume chest with scattered areas of subsegmental atelectasis and RIGHT middle lobe atelectasis.   Electronically Signed   By: Andreas Newport M.D.   On: 06/22/2015 09:59   Dg Chest Port 1 View  06/24/2015   CLINICAL DATA:  Patient was hospitalized on 07/15 with right central chest pain, hyperglycemia, weakness, urinary tract infection, and cough for 4 days. Started having difficulty breathing during the night.  EXAM: PORTABLE CHEST - 1 VIEW  COMPARISON:  06/22/2015  FINDINGS: Shallow inspiration with atelectasis or infiltration in the lung bases. Heart size and pulmonary vascularity are normal for technique. No pneumothorax. No blunting of costophrenic angles.  IMPRESSION: Shallow inspiration with atelectasis or infiltration in both lung bases.  Electronically Signed   By: Burman NievesWilliam  Stevens M.D.   On: 06/24/2015 06:08     ASSESSMENT AND PLAN:   [age] f with DM,  CHF, Schizoaffective disorder here from group home due to cough, hyperglycemia  * Bibasilar pneumonia Started on IV Levaquin. Sent sputum cultures. Daily nebulizers.  * Acute respiratory failure, Hypoxic Oxygen to keep saturations greater than 92%  * UTI On IV antibiotics.  * DM, Uncontrolled Home meds. SSI, ADA.  * CHF Diastolic vs systolic On torsemide at home. BNP and CXR normal here.  * Seizures On depakote  * AOCD Stable  All the records are reviewed and case discussed with Care Management/Social Workerr. Management plans discussed with the patient, family and they are in agreement.  CODE STATUS: FULL  DVT Prophylaxis: SCDs  TOTAL TIME TAKING CARE OF THIS PATIENT: 35 minutes.   POSSIBLE D/C IN 2 DAYS, DEPENDING ON CLINICAL CONDITION.   Milagros LollSudini, Christphor Groft R M.D on 06/23/2015 at 9:34 AM  Between 7am to 6pm - Pager - [phone]  After 6pm go to [email or web address] - password [email or web address] [facility]  [facility]  Office  [phone]  CC: Primary care physician; Derwood KaplanEason,  Ernest B, MD

## 2015-06-24 NOTE — Plan of Care (Signed)
Problem: Discharge Progression Outcomes Goal: Other Discharge Outcomes/Goals Outcome: Progressing Plan of Care Progress to Goal:   Pt has been sleeping most of the shift. Pt denies pain. No other signs of distress noted. Will continue to monitor.

## 2015-06-24 NOTE — Progress Notes (Signed)
Pt O2 was 90% on 3L at 05:10. On assessment pt was pale and clammy, pt report chest pain. Stat BS was taken and pt BS was 178. A rapid response was called and pt oxygen was increased to 6L. Pt was still low 80s on 6L. Pt was placed on non-rebreather. MD was paged. A stat EKG, troponin, lasix and nitro order was placed. A foley catheter was placed after pt was stable to measure strict I&O because pt is incontinent. Pt lung sounds has improved since the administration of the lasix. Pt report feeling better. No other signs of distress noted. Will continue to monitor.

## 2015-06-24 NOTE — Progress Notes (Signed)
[facility] Physicians - [place] at [facility]   PATIENT NAME: Candice Sawyer    MR#:  [id number]  DATE OF BIRTH:  [birth year]  SUBJECTIVE:  CHIEF COMPLAINT:   Chief Complaint  Patient presents with  . Hyperglycemia  . Weakness  . Cough   Desated earlier in the morning and received dose of lasix. Good diuresis. Now desating into 80s on 4 L O2. Patient has poor cough. Poor historian.  REVIEW OF SYSTEMS:    ROS Unobtainable Dementia   DRUG ALLERGIES:   Allergies  Allergen Reactions  . Haloperidol And Related Anaphylaxis  . Stelazine [Trifluoperazine] Anaphylaxis  . Bupropion Other (See Comments)    Reaction:  Unknown   . Losartan Potassium Other (See Comments)    Reaction:  Unknown   . Thorazine [Chlorpromazine] Other (See Comments)    Reaction:  Unknown   . Trazodone And Nefazodone Other (See Comments)    Reaction:  Unknown     VITALS:  Blood pressure 112/54, pulse 80, temperature 98.7 F (37.1 C), temperature source Oral, resp. rate 20, height 5' (1.524 m), weight 82.419 kg (181 lb 11.2 oz), SpO2 93 %.  PHYSICAL EXAMINATION:   Physical Exam  GENERAL:  54 y.o.-year-old patient lying in the bed with no acute distress.  EYES: Pupils equal, round, reactive to light and accommodation. No scleral icterus. Extraocular muscles intact.  HEENT: Head atraumatic, normocephalic. Oropharynx and nasopharynx clear.  NECK:  Supple, no jugular venous distention. No thyroid enlargement, no tenderness.  LUNGS: Bilateral coarse breath sounds. Increased work of breathing. Wheezing bilateral. CARDIOVASCULAR: S1, S2 normal. No murmurs, rubs, or gallops.  ABDOMEN: Soft, nontender, nondistended. Bowel sounds present. No organomegaly or mass.  EXTREMITIES: No cyanosis, clubbing or edema b/l.    NEUROLOGIC: Cranial nerves II through XII are intact. No focal Motor or sensory deficits b/l.   PSYCHIATRIC: The patient is alert and awake. Flat affect. SKIN: No obvious rash,  lesion, or ulcer.    LABORATORY PANEL:   CBC  Recent Labs Lab 06/22/15 1054  WBC 13.6*  HGB 10.7*  HCT 33.3*  PLT 269   ------------------------------------------------------------------------------------------------------------------  Chemistries   Recent Labs Lab 06/22/15 1054  NA 137  K 3.8  CL 91*  CO2 31  GLUCOSE 306*  BUN 18  CREATININE 1.21*  CALCIUM 9.0   ------------------------------------------------------------------------------------------------------------------  Cardiac Enzymes  Recent Labs Lab 06/24/15 0637  TROPONINI <0.03   ------------------------------------------------------------------------------------------------------------------  RADIOLOGY:  Dg Chest 2 View  06/22/2015   CLINICAL DATA:  Cough. High blood sugar for 1 week. Cough for 2 days. Weakness.  EXAM: CHEST  2 VIEW  COMPARISON:  04/15/2015.  Multiple priors dating back to 08/26/2013.  FINDINGS: There are chronically low lung volumes in the chest. RIGHT middle lobe atelectasis is evident on the lateral view. Subsegmental atelectasis or scarring in the RIGHT midlung. No definite airspace disease. LEFT lower lobe atelectasis is also present.  IMPRESSION: Low volume chest with scattered areas of subsegmental atelectasis and RIGHT middle lobe atelectasis.   Electronically Signed   By: Andreas Newport M.D.   On: 06/22/2015 09:59   Dg Chest Port 1 View  06/24/2015   CLINICAL DATA:  Patient was hospitalized on 07/15 with right central chest pain, hyperglycemia, weakness, urinary tract infection, and cough for 4 days. Started having difficulty breathing during the night.  EXAM: PORTABLE CHEST - 1 VIEW  COMPARISON:  06/22/2015  FINDINGS: Shallow inspiration with atelectasis or infiltration in the lung bases. Heart size and pulmonary vascularity  are normal for technique. No pneumothorax. No blunting of costophrenic angles.  IMPRESSION: Shallow inspiration with atelectasis or infiltration in both lung  bases.   Electronically Signed   By: Burman NievesWilliam  Stevens M.D.   On: 06/24/2015 06:08     ASSESSMENT AND PLAN:   89 f with DM, CHF, Schizoaffective disorder here from group home due to cough, hyperglycemia  * Bibasilar pneumonia Started on IV Levaquin. Sent sputum cultures but inadequate sample. Will repeat. Scheduled nebulizers.  * Acute respiratory failure, Hypoxic Oxygen to keep saturations greater than 92%. Likely combination of Pneumonia and CHF. Nebulizers scheduled. Flutter valve. Poor cough. If no improvement will check CT scan. Check Cr.  * Acute on chronic diastolic chf STAT lasix IV now. I/Os. On oral torsemide.  * UTI On IV antibiotics.  * DM, Uncontrolled Home meds. SSI, ADA.  * Seizures On depakote  * AOCD Stable  All the records are reviewed and case discussed with Care Management/Social Workerr. Management plans discussed with the family and they are in agreement.  CODE STATUS: FULL  DVT Prophylaxis: SCDs  TOTAL CRITICAL CARE TIME TAKING CARE OF THIS PATIENT: 35 minutes.   Milagros LollSudini, Zanyla Klebba R M.D on 06/23/2015 at 9:37 AM  Between 7am to 6pm - Pager - [phone]  After 6pm go to [email or web address] - password [email or web address] [facility]  [facility]  Office  [phone]  CC: Primary care physician; Derwood KaplanEason,  Ernest B, MD

## 2015-06-24 NOTE — Progress Notes (Signed)
   06/24/15 0500  Clinical Encounter Type  Visited With Patient  Visit Type Code  Referral From Nurse  Consult/Referral To Chaplain  Spiritual Encounters  Spiritual Needs Prayer;Emotional  Stress Factors  Patient Stress Factors Exhausted;Health changes  Responded with RRT. Provided spiritual support to team & patient. Prayer. Chap. Daisee Centner G. Lilyan Prete, ext. [phone]

## 2015-06-24 NOTE — Plan of Care (Signed)
Problem: Discharge Progression Outcomes Goal: Barriers To Progression Addressed/Resolved Individualization:  Outcome: Progressing Chair  Good part of day Goal: O2 sats at patient's baseline Outcome: Progressing 02 5l [place] now satsa still drop when 02 lower. Will cont to try and wean 02. Goal: Hemodynamically stable Outcome: Not Progressing Lungs  Improved from this am.  Earlier pt had wheezes / crackles and rales fwe crackles now  Diminished breath sounds. encourging use of flutter valve.  Lasix iv given today and pt diuresing.well.  Goal: Activity appropriate for discharge plan Outcome: Progressing chair

## 2015-06-24 NOTE — Progress Notes (Signed)
Pt. Given acapella with instructions.Tolerated well.

## 2015-06-25 ENCOUNTER — Inpatient Hospital Stay: Payer: Medicare Other

## 2015-06-25 LAB — GLUCOSE, CAPILLARY
GLUCOSE-CAPILLARY: 196 mg/dL — AB (ref 65–99)
GLUCOSE-CAPILLARY: 298 mg/dL — AB (ref 65–99)
Glucose-Capillary: 263 mg/dL — ABNORMAL HIGH (ref 65–99)
Glucose-Capillary: 410 mg/dL — ABNORMAL HIGH (ref 65–99)
Glucose-Capillary: 419 mg/dL — ABNORMAL HIGH (ref 65–99)
Glucose-Capillary: 374 mg/dL — ABNORMAL HIGH (ref 65–99)

## 2015-06-25 MED ORDER — PREDNISONE 20 MG PO TABS
50.0000 mg | ORAL_TABLET | Freq: Every day | ORAL | Status: DC
  Administered 2015-06-26: 50 mg via ORAL
  Filled 2015-06-25: qty 1

## 2015-06-25 MED ORDER — LEVOFLOXACIN IN D5W 750 MG/150ML IV SOLN
750.0000 mg | INTRAVENOUS | Status: DC
  Administered 2015-06-25: 14:00:00 750 mg via INTRAVENOUS
  Filled 2015-06-25 (×3): qty 150

## 2015-06-25 MED ORDER — INSULIN ASPART 100 UNIT/ML ~~LOC~~ SOLN
20.0000 [IU] | Freq: Once | SUBCUTANEOUS | Status: AC
  Administered 2015-06-25: 20 [IU] via SUBCUTANEOUS
  Filled 2015-06-25: qty 20

## 2015-06-25 NOTE — Plan of Care (Signed)
Problem: Discharge Progression Outcomes Goal: Other Discharge Outcomes/Goals Plan of care progress to goal: O2 continues to require 5L [place] Activity: 1 assist, foley in place

## 2015-06-25 NOTE — Care Management Important Message (Signed)
Important Message  Patient Details  Name: Candice DoomLinda J Luebbe MRN: [id number] Date of Birth: Sep 22, 1961   Medicare Important Message Given:  Yes-second notification given    Olegario MessierKathy A Allmond 06/25/2015, 2:11 PM

## 2015-06-25 NOTE — Care Management (Signed)
Ad[facility] with the diagnosis [facility] pneumonia. [facility]. Likes living at this care home. Sister is Gilford RileBrenda Hudson [phone]). Sees Dr. Maryellen PileEason, seen last week.  Home health/physical therapy in the past. Doesn't remember name of agency. Uses a rolling walker to aid in ambulation. No home oxygen.  Oxygen 5 liters per nasal cannula. Foley in place. IV SoluMedrol. Physical therapy evaluation completed recommends skilled nursing facility. Spoke with Ms. Lamons and Hudson at the bedside. Ms. Adron BeneHensley would like to [facility] and be roommates with Nemiah CommanderPatricia Brown. Ms. Manson PasseyBrown is her [facility]. Will update Clinical Child psychotherapistocial Worker. Gwenette GreetBrenda S Holland RN MSN Care Management [phone]

## 2015-06-25 NOTE — Progress Notes (Signed)
Patient weaned to 4L-O2, sat 94% at this time. Bo McclintockBrewer,Camyah Pultz S, RN

## 2015-06-25 NOTE — Progress Notes (Signed)
Dr. Elpidio AnisSudini notified of blood glucose level of 410. Per MD - give 20 units. Bo McclintockBrewer,Ladesha Pacini S, RN

## 2015-06-25 NOTE — Plan of Care (Signed)
Problem: Discharge Progression Outcomes Goal: Other Discharge Outcomes/Goals Outcome: Progressing Plan of care progress to goal:  Patient remains on 5L-O2. Foley in place. IV Levaquin. Scheduled neb treatments. Need sputum culture. CT chest today - possible aspiration pneumonia.   No improvement noted. Sister at bedside. Possibly discharging to [facility] when appropriate.

## 2015-06-25 NOTE — Progress Notes (Signed)
Inpatient Diabetes Program Recommendations  AACE/ADA: New Consensus Statement on Inpatient Glycemic Control (2013)  Target Ranges:  Prepandial:   less than 140 mg/dL      Peak postprandial:   less than 180 mg/dL (1-2 hours)      Critically ill patients:  140 - 180 mg/dL   Reason for Assessment:   Results for Candice Sawyer, Candice Sawyer (MRN [id number]) as of 06/25/2015 13:30  Ref. Range 06/24/2015 11:02 06/24/2015 16:08 06/24/2015 21:56 06/25/2015 07:11 06/25/2015 11:08  Glucose-Capillary Latest Ref Range: 65-99 mg/dL 742216 (H) 595333 (H) 638339 (H) 196 (H) 263 (H)   Diabetes history: Type 2 diabetes Outpatient Diabetes medications: Tradjenta 5 mg daily, Metformin 1000 mg bid,  Current orders for Inpatient glycemic control:  Novolog moderate tid with meals and HS, Tradjenta 5 mg daily, Metformin 1000 mg bid -Note that patient is also on Prednisone 50 mg daily which is likely increasing post-prandial blood sugars  Please consider ordering A1C to determine pre-hospitalization glycemic control.  Also consider adding Lantus 15 units daily and Novolog 4 units tid with meals (Hold if patient eats less than 50%) while in the hospital.  Thanks, Beryl MeagerJenny Santhosh Gulino, RN, BC-ADM Inpatient Diabetes Coordinator Pager [phone] (8a-5p)

## 2015-06-25 NOTE — Progress Notes (Signed)
PT Cancellation Note  Patient Details Name: Candice DoomLinda J Crystal MRN: [id number] DOB: November 08, 1961   Cancelled Treatment:    Reason Eval/Treat Not Completed: Patient at procedure or test/unavailable. Patient is going for a CT scan of the chest "any minute now". Patient noted to have low oxygen by the RN in the room. PT deferred treatment given the above, and will continue to attempt treatment as patient is available and appropriate.    Kerin RansomPatrick A McNamara, PT, DPT    06/25/2015, 2:17 PM

## 2015-06-25 NOTE — Progress Notes (Signed)
[facility] Physicians - [place] at [facility]   PATIENT NAME: Candice Sawyer    MR#:  [id number]  DATE OF BIRTH:  1961/03/21  SUBJECTIVE:  CHIEF COMPLAINT:   Chief Complaint  Patient presents with  . Hyperglycemia  . Weakness  . Cough   Admitted for cough, hyperglycemia. Now on 4 L O2.  REVIEW OF SYSTEMS:    ROS Unobtainable Dementia   DRUG ALLERGIES:   Allergies  Allergen Reactions  . Haloperidol And Related Anaphylaxis  . Stelazine [Trifluoperazine] Anaphylaxis  . Bupropion Other (See Comments)    Reaction:  Unknown   . Losartan Potassium Other (See Comments)    Reaction:  Unknown   . Thorazine [Chlorpromazine] Other (See Comments)    Reaction:  Unknown   . Trazodone And Nefazodone Other (See Comments)    Reaction:  Unknown     VITALS:  Blood pressure 128/53, pulse 95, temperature 98.4 F (36.9 C), temperature source Oral, resp. rate 20, height 5' (1.524 m), weight 82.464 kg (181 lb 12.8 oz), SpO2 92 %.  PHYSICAL EXAMINATION:   Physical Exam  GENERAL:  54 y.o.-year-old patient lying in the bed with no acute distress.  EYES: Pupils equal, round, reactive to light and accommodation. No scleral icterus. Extraocular muscles intact.  HEENT: Head atraumatic, normocephalic. Oropharynx and nasopharynx clear.  NECK:  Supple, no jugular venous distention. No thyroid enlargement, no tenderness.  LUNGS: Bilateral coarse breath sounds. Increased work of breathing. Wheezing bilateral. CARDIOVASCULAR: S1, S2 normal. No murmurs, rubs, or gallops.  ABDOMEN: Soft, nontender, nondistended. Bowel sounds present. No organomegaly or mass.  EXTREMITIES: No cyanosis, clubbing or edema b/l.    NEUROLOGIC: Cranial nerves II through XII are intact. No focal Motor or sensory deficits b/l.   PSYCHIATRIC: The patient is alert and awake. Flat affect. SKIN: No obvious rash, lesion, or ulcer.    LABORATORY PANEL:   CBC  Recent Labs Lab 06/24/15 0637  WBC 7.7  HGB  9.9*  HCT 29.9*  PLT 230   ------------------------------------------------------------------------------------------------------------------  Chemistries   Recent Labs Lab 06/24/15 0637  NA 138  K 3.7  CL 92*  CO2 33*  GLUCOSE 198*  BUN 14  CREATININE 1.15*  CALCIUM 8.4*   ------------------------------------------------------------------------------------------------------------------  Cardiac Enzymes  Recent Labs Lab 06/24/15 0865  TROPONINI <0.03   ------------------------------------------------------------------------------------------------------------------  RADIOLOGY:  Dg Chest Port 1 View  06/24/2015   CLINICAL DATA:  Patient was hospitalized on 07/15 with right central chest pain, hyperglycemia, weakness, urinary tract infection, and cough for 4 days. Started having difficulty breathing during the night.  EXAM: PORTABLE CHEST - 1 VIEW  COMPARISON:  06/22/2015  FINDINGS: Shallow inspiration with atelectasis or infiltration in the lung bases. Heart size and pulmonary vascularity are normal for technique. No pneumothorax. No blunting of costophrenic angles.  IMPRESSION: Shallow inspiration with atelectasis or infiltration in both lung bases.   Electronically Signed   By: Burman Nieves M.D.   On: 06/24/2015 06:08     ASSESSMENT AND PLAN:   54 f with DM, CHF, Schizoaffective disorder here from group home due to cough, hyperglycemia  * Bibasilar pneumonia On IV Levaquin. Sent sputum cultures but inadequate sample. Will repeat. Scheduled nebulizers. Check Ct chest as she is not improving.  * Acute respiratory failure, Hypoxic Due to CHF and Pneumonia. O2 to for sats > 92%  * Acute on chronic diastolic chf - Improving I/Os. On oral torsemide.  * UTI On IV antibiotics.  * DM, Uncontrolled Home meds. SSI,  ADA.  * Seizures On depakote  * AOCD Stable  All the records are reviewed and case discussed with Care Management/Social Workerr. Management  plans discussed with the family and they are in agreement.  CODE STATUS: FULL  DVT Prophylaxis: SCDs  TOTAL TIME TAKING CARE OF THIS PATIENT: 35 minutes.   Milagros LollSudini, Desmen Schoffstall R M.D on 06/23/2015 at 12:44 PM  Between 7am to 6pm - Pager - [phone]  After 6pm go to [email or web address] - password [email or web address] [facility]  [facility]  Office  [phone]  CC: Primary care physician; Derwood KaplanEason,  Ernest B, MD

## 2015-06-26 LAB — BLOOD GAS, ARTERIAL
ACID-BASE EXCESS: 12.4 mmol/L — AB (ref 0.0–3.0)
ALLENS TEST (PASS/FAIL): POSITIVE — AB
Bicarbonate: 37.4 mEq/L — ABNORMAL HIGH (ref 21.0–28.0)
FIO2: 100 %
O2 SAT: 93.4 %
PATIENT TEMPERATURE: 37
PO2 ART: 62 mmHg — AB (ref 83.0–108.0)
pCO2 arterial: 48 mmHg (ref 32.0–48.0)
pH, Arterial: 7.5 — ABNORMAL HIGH (ref 7.350–7.450)

## 2015-06-26 LAB — CBC WITH DIFFERENTIAL/PLATELET
Basophils Absolute: 0 10*3/uL (ref 0–0.1)
Basophils Relative: 0 %
EOS ABS: 0 10*3/uL (ref 0–0.7)
EOS PCT: 0 %
HCT: 28.9 % — ABNORMAL LOW (ref 35.0–47.0)
Hemoglobin: 9.9 g/dL — ABNORMAL LOW (ref 12.0–16.0)
LYMPHS ABS: 1.9 10*3/uL (ref 1.0–3.6)
Lymphocytes Relative: 16 %
MCH: 31.9 pg (ref 26.0–34.0)
MCHC: 34.1 g/dL (ref 32.0–36.0)
MCV: 93.6 fL (ref 80.0–100.0)
Monocytes Absolute: 1 10*3/uL — ABNORMAL HIGH (ref 0.2–0.9)
Monocytes Relative: 9 %
Neutro Abs: 8.6 10*3/uL — ABNORMAL HIGH (ref 1.4–6.5)
Neutrophils Relative %: 75 %
Platelets: 262 10*3/uL (ref 150–440)
RBC: 3.09 MIL/uL — ABNORMAL LOW (ref 3.80–5.20)
RDW: 15.8 % — AB (ref 11.5–14.5)
WBC: 11.4 10*3/uL — ABNORMAL HIGH (ref 3.6–11.0)

## 2015-06-26 LAB — BASIC METABOLIC PANEL
Anion gap: 14 (ref 5–15)
BUN: 28 mg/dL — ABNORMAL HIGH (ref 6–20)
CALCIUM: 8.4 mg/dL — AB (ref 8.9–10.3)
CHLORIDE: 91 mmol/L — AB (ref 101–111)
CO2: 34 mmol/L — ABNORMAL HIGH (ref 22–32)
CREATININE: 1.16 mg/dL — AB (ref 0.44–1.00)
GFR, EST NON AFRICAN AMERICAN: 52 mL/min — AB (ref >60)
Glucose, Bld: 201 mg/dL — ABNORMAL HIGH (ref 65–99)
Potassium: 4.2 mmol/L (ref 3.5–5.1)
Sodium: 139 mmol/L (ref 135–145)

## 2015-06-26 LAB — GLUCOSE, CAPILLARY
GLUCOSE-CAPILLARY: 294 mg/dL — AB (ref 65–99)
Glucose-Capillary: 201 mg/dL — ABNORMAL HIGH (ref 65–99)
Glucose-Capillary: 250 mg/dL — ABNORMAL HIGH (ref 65–99)
Glucose-Capillary: 193 mg/dL — ABNORMAL HIGH (ref 65–99)

## 2015-06-26 LAB — MRSA PCR SCREENING: MRSA BY PCR: NEGATIVE

## 2015-06-26 MED ORDER — SODIUM CHLORIDE 0.9 % IV SOLN
1500.0000 mg | Freq: Once | INTRAVENOUS | Status: AC
  Administered 2015-06-26: 1500 mg via INTRAVENOUS
  Filled 2015-06-26: qty 1500

## 2015-06-26 MED ORDER — PREDNISONE 50 MG PO TABS
50.0000 mg | ORAL_TABLET | Freq: Every day | ORAL | Status: DC
  Administered 2015-06-27 – 2015-06-28 (×2): 50 mg via ORAL
  Filled 2015-06-26 (×3): qty 1

## 2015-06-26 MED ORDER — IPRATROPIUM-ALBUTEROL 0.5-2.5 (3) MG/3ML IN SOLN
3.0000 mL | Freq: Four times a day (QID) | RESPIRATORY_TRACT | Status: DC
  Administered 2015-06-26 – 2015-06-29 (×15): 3 mL via RESPIRATORY_TRACT
  Filled 2015-06-26 (×15): qty 3

## 2015-06-26 MED ORDER — PIPERACILLIN-TAZOBACTAM 3.375 G IVPB
3.3750 g | Freq: Three times a day (TID) | INTRAVENOUS | Status: DC
  Administered 2015-06-26 – 2015-06-29 (×10): 3.375 g via INTRAVENOUS
  Filled 2015-06-26 (×14): qty 50

## 2015-06-26 MED ORDER — VANCOMYCIN HCL 10 G IV SOLR
1250.0000 mg | INTRAVENOUS | Status: DC
  Administered 2015-06-27 – 2015-06-28 (×3): 1250 mg via INTRAVENOUS
  Filled 2015-06-26 (×4): qty 1250

## 2015-06-26 MED ORDER — BUDESONIDE 0.5 MG/2ML IN SUSP
0.5000 mg | Freq: Two times a day (BID) | RESPIRATORY_TRACT | Status: DC
  Administered 2015-06-26 – 2015-06-29 (×8): 0.5 mg via RESPIRATORY_TRACT
  Filled 2015-06-26 (×8): qty 2

## 2015-06-26 NOTE — Progress Notes (Signed)
PT Cancellation Note  Patient Details Name: Candice DoomLinda J Sawyer MRN: [id number] DOB: [birth year]   Cancelled Treatment:    Reason Eval/Treat Not Completed: Medical issues which prohibited therapy (Chart reviewed for attempted treatment session.  Patient noted with transfer to CCU due to worsening respiratory failure.  Will require new PT orders to resume services as medically appropriate.)   Ndea Kilroy H. Manson PasseyBrown, PT, DPT, NCS 06/26/2015, 10:47 AM [phone]

## 2015-06-26 NOTE — Progress Notes (Signed)
ANTIBIOTIC CONSULT NOTE - INITIAL  Pharmacy Consult for Vancomycin and Zosyn Indication: pneumonia  Allergies  Allergen Reactions  . Haloperidol And Related Anaphylaxis  . Stelazine [Trifluoperazine] Anaphylaxis  . Bupropion Other (See Comments)    Reaction:  Unknown   . Losartan Potassium Other (See Comments)    Reaction:  Unknown   . Thorazine [Chlorpromazine] Other (See Comments)    Reaction:  Unknown   . Trazodone And Nefazodone Other (See Comments)    Reaction:  Unknown     Patient Measurements: Height: 5' (152.4 cm) Weight: 178 lb 9.6 oz (81.012 kg) IBW/kg (Calculated) : 45.5 Adjusted Body Weight: 59.7 kg  Vital Signs: Temp: 98.3 F (36.8 C) (07/19 1020) Temp Source: Axillary (07/19 1020) BP: 119/54 mmHg (07/19 1020) Pulse Rate: 97 (07/19 1020) Intake/Output from previous day: 07/18 0701 - 07/19 0700 In: 630 [P.O.:480; IV Piggyback:150] Out: 3250 [Urine:3250] Intake/Output from this shift: Total I/O In: 360 [P.O.:360] Out: -   Labs:  Recent Labs  06/24/15 0637 06/26/15 0448  WBC 7.7 11.4*  HGB 9.9* 9.9*  PLT 230 262  CREATININE 1.15* 1.16*   Estimated Creatinine Clearance: 52.3 mL/min (by C-G formula based on Cr of 1.16). No results for input(s): VANCOTROUGH, VANCOPEAK, VANCORANDOM, GENTTROUGH, GENTPEAK, GENTRANDOM, TOBRATROUGH, TOBRAPEAK, TOBRARND, AMIKACINPEAK, AMIKACINTROU, AMIKACIN in the last 72 hours.   Microbiology: Recent Results (from the past 720 hour(s))  Urine culture     Status: None   Collection Time: 06/22/15 10:07 AM  Result Value Ref Range Status   Specimen Description URINE, RANDOM  Final   Special Requests NONE  Final   Culture   Final    MULTIPLE SPECIES PRESENT, SUGGEST RECOLLECTION IF CLINICALLY INDICATED   Report Status 06/23/2015 FINAL  Final  Culture, sputum-assessment     Status: None (Preliminary result)   Collection Time: 06/23/15  8:20 PM  Result Value Ref Range Status   Specimen Description EXPECTORATED SPUTUM   Final   Special Requests NONE  Final   Sputum evaluation   Final    Sputum specimen not acceptable for testing.  Please recollect.   Madelaine BhatCALLED CASEY VAUGHANS WARD AT 2102 06/23/15 FOR RECOLLECT.PMH   Report Status PENDING  Incomplete  MRSA PCR Screening     Status: None   Collection Time: 06/26/15 10:18 AM  Result Value Ref Range Status   MRSA by PCR NEGATIVE NEGATIVE Final    Comment:        The GeneXpert MRSA Assay (FDA approved for NASAL specimens only), is one component of a comprehensive MRSA colonization surveillance program. It is not intended to diagnose MRSA infection nor to guide or monitor treatment for MRSA infections.     Medical History: Past Medical History  Diagnosis Date  . Seizures   . Diabetes mellitus without complication   . CHF (congestive heart failure)   . Bipolar 1 disorder   . Schizophrenia   . Constipation   . CAD (coronary artery disease)   . HTN (hypertension)   . Obesity   . Dementia     Medications:  Infusions:   Assessment: [age] yof with acute respiratory failure transferred to ICU for progressive hypoxia, broadening antibiotics to include HCAP organisms.   Goal of Therapy:  Vancomycin trough level 15-20 mcg/ml  Plan:  Expected duration 10 days with resolution of temperature and/or normalization of WBC Measure antibiotic drug levels at steady state Follow up culture results  Carola FrostNathan A Ashe Graybeal 06/26/2015,12:02 PM

## 2015-06-26 NOTE — Plan of Care (Signed)
Problem: Discharge Progression Outcomes Goal: Other Discharge Outcomes/Goals Outcome: Progressing Patient continues on 4 L [place], no c/o pain or discomfort. Resting quietly. Foley in place.

## 2015-06-26 NOTE — Clinical Social Work Note (Signed)
Pt transferred from 1C to ICU. CSW updated ICU CSW. Pt will discharge to [facility] when medically stable. CSW will continue to follow.   Dede QuerySarah Jibran Crookshanks, MSW, LCSW Clinical Socail Worker  [phone]

## 2015-06-26 NOTE — Consult Note (Signed)
PULMONARY / CRITICAL CARE MEDICINE   Name: Candice Sawyer MRN: [id number] DOB: 1961/06/03    ADMISSION DATE:  06/22/2015    CHIEF COMPLAINT:   Acute resp failure   HISTORY OF PRESENT ILLNESS   54 yo white female transferred to ICU for acute resp failure and progressive hypoxic, patient is lethargic, Patient unable to clear secretions effectivley  Ct chest with b/l infiltrates    SIGNIFICANT EVENTS    ICU admission 7/19    PAST MEDICAL HISTORY    :  Past Medical History  Diagnosis Date  . Seizures   . Diabetes mellitus without complication   . CHF (congestive heart failure)   . Bipolar 1 disorder   . Schizophrenia   . Constipation   . CAD (coronary artery disease)   . HTN (hypertension)   . Obesity   . Dementia    History reviewed. No pertinent past surgical history. Prior to Admission medications   Medication Sig Start Date End Date Taking? Authorizing Provider  acetaminophen (TYLENOL) 500 MG tablet Take 500 mg by mouth every 4 (four) hours as needed for mild pain.   Yes Historical Provider, MD  aspirin EC 81 MG tablet Take 81 mg by mouth daily.   Yes Historical Provider, MD  atorvastatin (LIPITOR) 40 MG tablet Take 40 mg by mouth at bedtime.   Yes Historical Provider, MD  cloZAPine (CLOZARIL) 100 MG tablet Take 50-450 mg by mouth 2 (two) times daily. Pt takes 50mg  at 5pm and 450mg  at bedtime.   Yes Historical Provider, MD  divalproex (DEPAKOTE) 500 MG DR tablet Take 500 mg by mouth 3 (three) times daily.   Yes Historical Provider, MD  donepezil (ARICEPT) 23 MG TABS tablet Take 23 mg by mouth at bedtime.   Yes Historical Provider, MD  guaiFENesin (ROBITUSSIN) 100 MG/5ML liquid Take 200 mg by mouth 3 (three) times daily as needed for cough.   Yes Historical Provider, MD  Linaclotide (LINZESS) 290 MCG CAPS capsule Take 290 mcg by mouth 3 (three) times a week. Pt uses on Monday, Wednesday, and Friday.   Yes Historical Provider, MD  linagliptin (TRADJENTA) 5 MG  TABS tablet Take 5 mg by mouth daily.   Yes Historical Provider, MD  lubiprostone (AMITIZA) 24 MCG capsule Take 24 mcg by mouth daily.   Yes Historical Provider, MD  magnesium hydroxide (MILK OF MAGNESIA) 400 MG/5ML suspension Take 15 mLs by mouth daily as needed for heartburn or indigestion.    Yes Historical Provider, MD  metFORMIN (GLUCOPHAGE) 1000 MG tablet Take 1,000 mg by mouth 2 (two) times daily.    Yes Historical Provider, MD  omeprazole (PRILOSEC) 20 MG capsule Take 20 mg by mouth 2 (two) times daily.   Yes Historical Provider, MD  PARoxetine (PAXIL) 40 MG tablet Take 40 mg by mouth daily.   Yes Historical Provider, MD  potassium chloride SA (K-DUR,KLOR-CON) 20 MEQ tablet Take 20 mEq by mouth daily.   Yes Historical Provider, MD  senna-docusate (SENOKOT-S) 8.6-50 MG per tablet Take 1 tablet by mouth 2 (two) times daily as needed for mild constipation.    Yes Historical Provider, MD  torsemide (DEMADEX) 100 MG tablet Take 100 mg by mouth daily.    Yes Historical Provider, MD  divalproex (DEPAKOTE) 250 MG DR tablet Take 2 tablets (500 mg total) by mouth 3 (three) times daily. Patient not taking: Reported on 06/22/2015 04/15/15   Sharyn Creamer, MD   Allergies  Allergen Reactions  . Haloperidol And Related Anaphylaxis  .  Stelazine [Trifluoperazine] Anaphylaxis  . Bupropion Other (See Comments)    Reaction:  Unknown   . Losartan Potassium Other (See Comments)    Reaction:  Unknown   . Thorazine [Chlorpromazine] Other (See Comments)    Reaction:  Unknown   . Trazodone And Nefazodone Other (See Comments)    Reaction:  Unknown      FAMILY HISTORY   Family History  Problem Relation Age of Onset  . Other Other       SOCIAL HISTORY    reports that she has never smoked. She does not have any smokeless tobacco history on file. She reports that she does not drink alcohol or use illicit drugs.  Review of Systems  Unable to perform ROS: critical illness      VITAL SIGNS    Temp:   [98 F (36.7 C)-98.4 F (36.9 C)] 98.3 F (36.8 C) (07/19 1020) Pulse Rate:  [84-97] 97 (07/19 1020) Resp:  [17-37] 37 (07/19 1020) BP: (101-128)/(51-63) 119/54 mmHg (07/19 1020) SpO2:  [81 %-94 %] 91 % (07/19 1020) FiO2 (%):  [55 %-100 %] 100 % (07/19 1020) Weight:  [178 lb 9.6 oz (81.012 kg)] 178 lb 9.6 oz (81.012 kg) (07/19 0500) HEMODYNAMICS:   VENTILATOR SETTINGS: Vent Mode:  [-]  FiO2 (%):  [55 %-100 %] 100 % INTAKE / OUTPUT:  Intake/Output Summary (Last 24 hours) at 06/26/15 1105 Last data filed at 06/26/15 0900  Gross per 24 hour  Intake    870 ml  Output   3250 ml  Net  -2380 ml       PHYSICAL EXAM   Physical Exam  Constitutional: She appears distressed.  HENT:  Head: Normocephalic and atraumatic.  Eyes: Pupils are equal, round, and reactive to light. No scleral icterus.  Neck: Normal range of motion. Neck supple.  Cardiovascular: Normal rate and regular rhythm.   No murmur heard. Pulmonary/Chest: She is in respiratory distress. She has no wheezes. She has rales.  resp distress  Abdominal: Soft. She exhibits no distension. There is no tenderness.  Musculoskeletal: She exhibits no edema.  Neurological: She is alert. She displays normal reflexes. Coordination normal.  Skin: Skin is warm. No rash noted. She is diaphoretic.       LABS   LABS:  CBC  Recent Labs Lab 06/22/15 1054 06/24/15 0637 06/26/15 0448  WBC 13.6* 7.7 11.4*  HGB 10.7* 9.9* 9.9*  HCT 33.3* 29.9* 28.9*  PLT 269 230 262   Coag's No results for input(s): APTT, INR in the last 168 hours. BMET  Recent Labs Lab 06/22/15 1054 06/24/15 0637 06/26/15 0448  NA 137 138 139  K 3.8 3.7 4.2  CL 91* 92* 91*  CO2 31 33* 34*  BUN 18 14 28*  CREATININE 1.21* 1.15* 1.16*  GLUCOSE 306* 198* 201*   Electrolytes  Recent Labs Lab 06/22/15 1054 06/24/15 0637 06/26/15 0448  CALCIUM 9.0 8.4* 8.4*   Sepsis Markers No results for input(s): LATICACIDVEN, PROCALCITON, O2SATVEN in  the last 168 hours. ABG  Recent Labs Lab 06/26/15 0900  PHART 7.50*  PCO2ART 48  PO2ART 62*   Liver Enzymes No results for input(s): AST, ALT, ALKPHOS, BILITOT, ALBUMIN in the last 168 hours. Cardiac Enzymes  Recent Labs Lab 06/24/15 0637  TROPONINI <0.03   Glucose  Recent Labs Lab 06/25/15 0711 06/25/15 1108 06/25/15 1616 06/25/15 1747 06/25/15 2156 06/26/15 0733  GLUCAP 196* 263* 410*  419* 374* 298* 201*     Recent Results (from the past  240 hour(s))  Urine culture     Status: None   Collection Time: 06/22/15 10:07 AM  Result Value Ref Range Status   Specimen Description URINE, RANDOM  Final   Special Requests NONE  Final   Culture   Final    MULTIPLE SPECIES PRESENT, SUGGEST RECOLLECTION IF CLINICALLY INDICATED   Report Status 06/23/2015 FINAL  Final  Culture, sputum-assessment     Status: None (Preliminary result)   Collection Time: 06/23/15  8:20 PM  Result Value Ref Range Status   Specimen Description EXPECTORATED SPUTUM  Final   Special Requests NONE  Final   Sputum evaluation   Final    Sputum specimen not acceptable for testing.  Please recollect.   Madelaine Bhat WARD AT 2102 06/23/15 FOR RECOLLECT.PMH   Report Status PENDING  Incomplete     Current facility-administered medications:  .  acetaminophen (TYLENOL) tablet 650 mg, 650 mg, Oral, Q6H PRN **OR** acetaminophen (TYLENOL) suppository 650 mg, 650 mg, Rectal, Q6H PRN, Srikar Sudini, MD .  albuterol (PROVENTIL) (2.5 MG/3ML) 0.083% nebulizer solution 2.5 mg, 2.5 mg, Nebulization, Q4H PRN, Milagros Loll, MD .  aspirin EC tablet 81 mg, 81 mg, Oral, Daily, Milagros Loll, MD, 81 mg at 06/26/15 0938 .  atorvastatin (LIPITOR) tablet 40 mg, 40 mg, Oral, QHS, Srikar Sudini, MD, 40 mg at 06/25/15 2151 .  cloZAPine (CLOZARIL) tablet 450 mg, 450 mg, Oral, QHS, Srikar Sudini, MD, 450 mg at 06/25/15 2149 .  cloZAPine (CLOZARIL) tablet 50 mg, 50 mg, Oral, QAC supper, Milagros Loll, MD, 50 mg at  06/25/15 1649 .  divalproex (DEPAKOTE) DR tablet 500 mg, 500 mg, Oral, TID, Milagros Loll, MD, 500 mg at 06/26/15 0938 .  docusate sodium (COLACE) capsule 100 mg, 100 mg, Oral, BID, Milagros Loll, MD, 100 mg at 06/26/15 0938 .  donepezil (ARICEPT) tablet 23 mg, 23 mg, Oral, QHS, Srikar Sudini, MD, 23 mg at 06/25/15 2150 .  enoxaparin (LOVENOX) injection 40 mg, 40 mg, Subcutaneous, Q24H, Srikar Sudini, MD, 40 mg at 06/25/15 1312 .  guaiFENesin-dextromethorphan (ROBITUSSIN DM) 100-10 MG/5ML syrup 5 mL, 5 mL, Oral, Q4H PRN, Srikar Sudini, MD .  insulin aspart (novoLOG) injection 0-15 Units, 0-15 Units, Subcutaneous, TID WC, Milagros Loll, MD, 5 Units at 06/26/15 [id number] .  insulin aspart (novoLOG) injection 0-5 Units, 0-5 Units, Subcutaneous, QHS, Milagros Loll, MD, 3 Units at 06/25/15 2207 .  ipratropium-albuterol (DUONEB) 0.5-2.5 (3) MG/3ML nebulizer solution 3 mL, 3 mL, Nebulization, TID, Milagros Loll, MD, 3 mL at 06/26/15 0804 .  levofloxacin (LEVAQUIN) IVPB 750 mg, 750 mg, Intravenous, Q24H, Srikar Sudini, MD, 750 mg at 06/25/15 1408 .  Linaclotide (LINZESS) capsule 290 mcg, 290 mcg, Oral, Once per day on Mon Wed Fri, Srikar Sudini, MD, 290 mcg at 06/25/15 1312 .  linagliptin (TRADJENTA) tablet 5 mg, 5 mg, Oral, Daily, Milagros Loll, MD, 5 mg at 06/26/15 0938 .  lubiprostone (AMITIZA) capsule 24 mcg, 24 mcg, Oral, Daily, Milagros Loll, MD, 24 mcg at 06/25/15 1117 .  magnesium hydroxide (MILK OF MAGNESIA) suspension 15 mL, 15 mL, Oral, Daily PRN, Milagros Loll, MD .  metFORMIN (GLUCOPHAGE) tablet 1,000 mg, 1,000 mg, Oral, BID WC, Srikar Sudini, MD, 1,000 mg at 06/26/15 0938 .  nitroGLYCERIN (NITROGLYN) 2 % ointment 0.5 inch, 0.5 inch, Topical, 4 times per day, Arnaldo Natal, MD, 0.5 inch at 06/26/15 0532 .  ondansetron (ZOFRAN) tablet 4 mg, 4 mg, Oral, Q6H PRN **OR** ondansetron (ZOFRAN) injection 4 mg, 4 mg, Intravenous, Q6H PRN,  Srikar Sudini, MD .  oxyCODONE (Oxy IR/ROXICODONE) immediate  release tablet 5 mg, 5 mg, Oral, Q4H PRN, Srikar Sudini, MD .  pantoprazole (PROTONIX) EC tablet 40 mg, 40 mg, Oral, Daily, Milagros LollSrikar Sudini, MD, 40 mg at 06/26/15 0938 .  PARoxetine (PAXIL) tablet 40 mg, 40 mg, Oral, Daily, Milagros LollSrikar Sudini, MD, 40 mg at 06/26/15 0938 .  potassium chloride SA (K-DUR,KLOR-CON) CR tablet 20 mEq, 20 mEq, Oral, Daily, Milagros LollSrikar Sudini, MD, 20 mEq at 06/26/15 0938 .  predniSONE (DELTASONE) tablet 50 mg, 50 mg, Oral, Q breakfast, Milagros LollSrikar Sudini, MD, 50 mg at 06/26/15 0938 .  senna-docusate (Senokot-S) tablet 1 tablet, 1 tablet, Oral, BID PRN, Milagros LollSrikar Sudini, MD .  torsemide (DEMADEX) tablet 100 mg, 100 mg, Oral, Daily, Srikar Sudini, MD, 100 mg at 06/25/15 1126  IMAGING    Ct Chest Wo Contrast  06/25/2015   CLINICAL DATA:  54 year old female with recent hospital admission for cough. Shortness of breath and weakness for 1 week. No improvement of symptoms while hospitalized.  EXAM: CT CHEST WITHOUT CONTRAST  TECHNIQUE: Multidetector CT imaging of the chest was performed following the standard protocol without IV contrast.  COMPARISON:  No priors.  FINDINGS: Mediastinum/Lymph Nodes: Heart size is normal. There is no significant pericardial fluid, thickening or pericardial calcification. Multiple prominent borderline enlarged mediastinal lymph nodes are nonspecific, favored to be reactive. No pathologically enlarged mediastinal or hilar lymph nodes. Please note that accurate exclusion of hilar adenopathy is limited on noncontrast CT scans. Esophagus is unremarkable in appearance. No axillary lymphadenopathy.  Lungs/Pleura: There appears to be a combination of airspace consolidation and volume loss in the lower lobes of the lungs bilaterally, as well as the right middle lobe. Several fluid filled bronchi are noted, particularly on the right side where the bronchus intermedius, right middle lobe and right lower lobe bronchi are all opacified. No pleural effusions. No definite suspicious  appearing pulmonary nodules or masses.  Upper Abdomen: Unremarkable.  Musculoskeletal/Soft Tissues: There are no aggressive appearing lytic or blastic lesions noted in the visualized portions of the skeleton.  IMPRESSION: 1. There is a combination of airspace consolidation and atelectasis in the lower lobes of the lungs bilaterally and the right middle lobe, with large amount of fluid in the right-sided bronchi, as discussed above. Findings are favored to reflect an aspiration pneumonia.   Electronically Signed   By: Trudie Reedaniel  Entrikin M.D.   On: 06/25/2015 14:52     ASSESSMENT/PLAN   54 yo white female admitted to ICu for progressive hypoxic resp failure from acute bilateral pneumonia likely from aspiration  PULMONARY -Respiratory Failure -continue Full MV support -start  Bronchodilator Therapy -will try high  Flow/BiPAP if needed -wean fio2 as tolerated -high risk for intubation, no indication for bronch at this time   CARDIOVASCULAR Needs ICU monitoring  RENAL Follow chem 7 follow uo   HEMATOLOGIC Follow h/h  INFECTIOUS Start abx with vanomycin and zosyn -obtai sputum cultures if possible  ENDOCRINE Follow FSBS  NEUROLOGIC -patient with h/o mental disability, will look to sister HCPOA to make decisions    I have personally obtained a history, examined the patient, evaluated laboratory and imaging results, formulated the assessment and plan and placed orders.  The Patient requires high complexity decision making for assessment and support, frequent evaluation and titration of therapies, application of advanced monitoring technologies and extensive interpretation of multiple databases. Critical Care Time devoted to patient care services described in this note is 45 minutes.   Overall, patient is  critically ill, prognosis is guarded. Patient at high risk for cardiac arrest and death.   Lucie Leather, M.D. Pulmonary & Critical Care Medicine [facility] Medical  Director Intensive Care Unit   06/26/2015, 11:05 AM

## 2015-06-26 NOTE — Progress Notes (Signed)
[facility] [place] at [facility]   PATIENT NAME: Candice SoursLinda Sawyer    MR#:  [id number]  DATE OF BIRTH:  [birth year]  SUBJECTIVE:  CHIEF COMPLAINT:   Chief Complaint  Patient presents with  . Hyperglycemia  . Weakness  . Cough   Admitted for cough, hyperglycemia. Slowly getting worse with breathing and O2 requirements daily. Ct showed lot of mucous.  REVIEW OF SYSTEMS:    ROS Unobtainable Dementia   DRUG ALLERGIES:   Allergies  Allergen Reactions  . Haloperidol And Related Anaphylaxis  . Stelazine [Trifluoperazine] Anaphylaxis  . Bupropion Other (See Comments)    Reaction:  Unknown   . Losartan Potassium Other (See Comments)    Reaction:  Unknown   . Thorazine [Chlorpromazine] Other (See Comments)    Reaction:  Unknown   . Trazodone And Nefazodone Other (See Comments)    Reaction:  Unknown     VITALS:  Blood pressure 111/51, pulse 87, temperature 98 F (36.7 C), temperature source Oral, resp. rate 20, height 5' (1.524 m), weight 81.012 kg (178 lb 9.6 oz), SpO2 93 %.  PHYSICAL EXAMINATION:   Physical Exam  GENERAL:  54 y.o.-year-old patient lying in the bed , looks critically ill. EYES: Pupils equal, round, reactive to light and accommodation. No scleral icterus. Extraocular muscles intact.  HEENT: Head atraumatic, normocephalic. Oropharynx and nasopharynx clear.  NECK:  Supple, no jugular venous distention. No thyroid enlargement, no tenderness.  LUNGS: Bilateral coarse breath sounds. Increased work of breathing. Wheezing bilateral CARDIOVASCULAR : S1, S2 normal. No murmurs, rubs, or gallops.  ABDOMEN: Soft, nontender, nondistended. Bowel sounds present. No organomegaly or mass. Foley EXTREMITIES: No cyanosis, clubbing or edema b/l.    NEUROLOGIC: Cranial nerves II through XII are intact. No focal Motor or sensory deficits b/l.   PSYCHIATRIC: The patient is alert and awake. Flat affect SKIN: No obvious rash, lesion, or ulcer.     LABORATORY PANEL:   CBC  Recent Labs Lab 06/26/15 0448  WBC 11.4*  HGB 9.9*  HCT 28.9*  PLT 262   ------------------------------------------------------------------------------------------------------------------  Chemistries   Recent Labs Lab 06/26/15 0448  NA 139  K 4.2  CL 91*  CO2 34*  GLUCOSE 201*  BUN 28*  CREATININE 1.16*  CALCIUM 8.4*   ------------------------------------------------------------------------------------------------------------------  Cardiac Enzymes  Recent Labs Lab 06/24/15 0637  TROPONINI <0.03   ------------------------------------------------------------------------------------------------------------------  RADIOLOGY:  Ct Chest Wo Contrast  06/25/2015   CLINICAL DATA:  54 year old female with recent hospital admission for cough. Shortness of breath and weakness for 1 week. No improvement of symptoms while hospitalized.  EXAM: CT CHEST WITHOUT CONTRAST  TECHNIQUE: Multidetector CT imaging of the chest was performed following the standard protocol without IV contrast.  COMPARISON:  No priors.  FINDINGS: Mediastinum/Lymph Nodes: Heart size is normal. There is no significant pericardial fluid, thickening or pericardial calcification. Multiple prominent borderline enlarged mediastinal lymph nodes are nonspecific, favored to be reactive. No pathologically enlarged mediastinal or hilar lymph nodes. Please note that accurate exclusion of hilar adenopathy is limited on noncontrast CT scans. Esophagus is unremarkable in appearance. No axillary lymphadenopathy.  Lungs/Pleura: There appears to be a combination of airspace consolidation and volume loss in the lower lobes of the lungs bilaterally, as well as the right middle lobe. Several fluid filled bronchi are noted, particularly on the right side where the bronchus intermedius, right middle lobe and right lower lobe bronchi are all opacified. No pleural effusions. No definite suspicious appearing  pulmonary nodules or masses.  Upper Abdomen: Unremarkable.  Musculoskeletal/Soft Tissues: There are no aggressive appearing lytic or blastic lesions noted in the visualized portions of the skeleton.  IMPRESSION: 1. There is a combination of airspace consolidation and atelectasis in the lower lobes of the lungs bilaterally and the right middle lobe, with large amount of fluid in the right-sided bronchi, as discussed above. Findings are favored to reflect an aspiration pneumonia.   Electronically Signed   By: Trudie Reed M.D.   On: 06/25/2015 14:52     ASSESSMENT AND PLAN:   40 f with DM, CHF, Schizoaffective disorder here from group home due to cough, hyperglycemia  * Bilateral pneumonia On IV Levaquin. Sent sputum cultures but inadequate sample. Will repeat. Scheduled nebulizers. Ct scan shows lot of mucous and raised concern regarding aspiration. Pt made NPO. Seen by speech for bedside swallow eval and she did well.  * Acute respiratory failure, Hypoxic Due to  Pneumonia. O2 to for sats > 92% Worsened significantly. Transfer to ICU. On NRB. May need intubation and Bronchoscopy if no improvement. Discussed with Dr. Belia Heman  * Acute on chronic diastolic chf - Diuresed I/Os. On oral torsemide.  * UTI On IV antibiotics.  * DM, Uncontrolled Home meds. SSI, ADA.  * Seizures On depakote  * AOCD Stable  All the records are reviewed and case discussed with Care Management/Social Workerr. Management plans discussed with the family and they are in agreement.  CODE STATUS: FULL Discussed with HCPOA- Sister. Says quality of life is important. Was on ventilator in the past. Would like short term life support if it helps.  DVT Prophylaxis: SCDs, Lovenox  TOTAL CRITICAL CARE TIME TAKING CARE OF THIS PATIENT: 40 minutes.   Milagros Loll R M.D on 06/23/2015 at 9:45 AM  Between 7am to 6pm - Pager - [phone]  After 6pm go to [email or web address] - password [email or web address] [facility]  [place]  [place] [facility]  Office  [phone]  CC: Primary care physician; Derwood Kaplan, MD

## 2015-06-26 NOTE — Clinical Social Work Placement (Signed)
Late entry 06/26/15 for 06/25/15  CLINICAL SOCIAL WORK PLACEMENT  NOTE  Date:  06/26/2015  Patient Details  Name: Candice Sawyer MRN: [id number] Date of Birth: November 09, 1961  Clinical Social Work is seeking post-discharge placement for this patient at the Skilled  Nursing Facility level of care (*CSW will initial, date and re-position this form in  chart as items are completed):  No   Patient/family provided with [facility] Clinical Social Work Department's list of facilities offering this level of care within the geographic area requested by the patient (or if unable, by the patient's family).  No   Patient/family informed of their freedom to choose among providers that offer the needed level of care, that participate in Medicare, Medicaid or managed care program needed by the patient, have an available bed and are willing to accept the patient.  No   Patient/family informed of 's ownership interest in [facility] and [facility], as well as of the fact that they are under no obligation to receive care at these facilities.  PASRR submitted to EDS on       PASRR number received on       Existing PASRR number confirmed on 06/25/15     FL2 transmitted to all facilities in geographic area requested by pt/family on 06/25/15     FL2 transmitted to all facilities within larger geographic area on       Patient informed that his/her managed care company has contracts with or will negotiate with certain facilities, including the following:        Yes   Patient/family informed of bed offers received.  [facility]     Physician recommends and patient chooses bed at      [facility] on  .  Patient to be transferred to facility by       Patient family notified on   of transfer.  Name of family member notified:        PHYSICIAN Please sign FL2     Additional Comment:     _______________________________________________ Ned Cardara N [name], LCSW 06/26/2015, 11:43 PM

## 2015-06-26 NOTE — Evaluation (Signed)
Clinical/Bedside Swallow Evaluation Patient Details  Name: ONIKA GUDIEL MRN: [id number] Date of Birth: 1961-02-02  Today's Date: 06/26/2015 Time: SLP Start Time (ACUTE ONLY): 0755 SLP Stop Time (ACUTE ONLY): 0855 SLP Time Calculation (min) (ACUTE ONLY): 60 min  Past Medical History:  Past Medical History  Diagnosis Date  . Seizures   . Diabetes mellitus without complication   . CHF (congestive heart failure)   . Bipolar 1 disorder   . Schizophrenia   . Constipation   . CAD (coronary artery disease)   . HTN (hypertension)   . Obesity   . Dementia    Past Surgical History: History reviewed. No pertinent past surgical history. HPI:  Pt is a 54 y.o. female with a known history of CHF, seizures, CAD, diabetes mellitus, dementia, schizophrenia, bipolar dis., Obesity who presents from group home with complaints of weakness, pleuritic chest pain and cough. Some hyperglycemia. Recently started on new diabetes medication by PCP. She was been found to have bibasilar pneumonia along with a urinary tract infection. Saturations on room air 85% at admission. Currently, pt's O2 support was increased last evening and her O2 sats remain ~80%, per RT. Pt is on a regular diet consistency since admission per MD order. Pt and NSG deny any s/s of aspiration w/ po meds, diet this AM. Pt answers basic questions re: self w/ limited details and "yes/no" answers; suspect this could be related to her baseline Cognitive status.   Assessment / Plan / Recommendation Clinical Impression  Pt appeared to tolerate trials of thin liquids, purees, and soft solids w/ no overt s/s of aspiration noted; no change in vocal quality post trials, no decline in respiratory status/change of breathing w/ trials or b/t trials. Pt's oral phase of swallowing was grossly wfl; pt exhibited slightly impulsive feeding behavior as she ate her yogurt but suspect this could be baseline behavior for pt sec. to her Cognitive status. Pt was  assisted w/ drinking d/t shaky UEs holding the cup. RT was present as pt ate and drank in order to give a breathing tx; noted lower O2 sats requiring increased O2 support via ventimask following the breathing tx. RT agreed pt did not appear in any distress at rest or w/ po's. Consulted NSG re: pt's status and need for general aspiration precautions and assistance at meals sec. to O2 mask needs at this time. Rec. meds in puree for easier swallowing currently. NSG agreed. Will f/u w/ MD.       Aspiration Risk   (reduced w/ general aspiration precautions)    Diet Recommendation Dysphagia 2 (Fine chop);Thin (for easier intake; less exertion at this time)   Medication Administration: Whole meds with puree Compensations: Slow rate;Small sips/bites (rest breaks during meals as Arther Dames. )    Other  Recommendations Recommended Consults:  (TBD) Oral Care Recommendations: Staff/trained caregiver to provide oral care;Oral care BID;Oral care before and after PO   Follow Up Recommendations       Frequency and Duration min 3x week  1 week   Pertinent Vitals/Pain Denied any pain    SLP Swallow Goals  see care plan    Swallow Study Prior Functional Status   resides at a Group Home. No reports per chart of any dysphagia.    General Date of Onset: 06/25/15 (received order ) Other Pertinent Information: Pt is a 54 y.o. female with a known history of CHF, seizures, CAD, diabetes mellitus, dementia, schizophrenia, bipolar dis., Obesity who presents from group home with complaints of  weakness, pleuritic chest pain and cough. Some hyperglycemia. Recently started on new diabetes medication by PCP. She was been found to have bibasilar pneumonia along with a urinary tract infection. Saturations on room air 85% at admission. Currently, pt's O2 support was increased last evening and her O2 sats remain ~80%, per RT. Pt is on a regular diet consistency since admission per MD order. Pt and NSG deny any s/s of aspiration  w/ po meds, diet this AM. Pt answers basic questions re: self w/ limited details and "yes/no" answers; suspect this could be related to her baseline Cognitive status. Type of Study: Bedside swallow evaluation Previous Swallow Assessment: none indicated Diet Prior to this Study: Regular;Thin liquids Temperature Spikes Noted: No (WBC elevated today; not yesterday) Respiratory Status: Supplemental O2 delivered via (comment) History of Recent Intubation: No Behavior/Cognition: Alert;Cooperative;Requires cueing;Distractible (min. ) Oral Cavity - Dentition: Missing dentition Self-Feeding Abilities: Able to feed self (appears min. impulsive) Patient Positioning: Upright in bed Baseline Vocal Quality: Normal;Low vocal intensity Volitional Cough:  (Fair but not always productive) Volitional Swallow: Able to elicit    Oral/Motor/Sensory Function Overall Oral Motor/Sensory Function: Appears within functional limits for tasks assessed (slightly limited follow through w/ tasks intermittently) Labial ROM: Within Functional Limits Labial Symmetry: Within Functional Limits Lingual ROM: Within Functional Limits Lingual Symmetry: Within Functional Limits Lingual Strength: Within Functional Limits Facial Symmetry: Within Functional Limits Velum: Within Functional Limits Mandible: Within Functional Limits   Ice Chips Ice chips: Not tested Other Comments: pt was already eating her yogurt   Thin Liquid Thin Liquid: Within functional limits Presentation: Cup;Straw (needed assistance to hold cup - shaky UEs)    Nectar Thick Nectar Thick Liquid: Not tested   Honey Thick Honey Thick Liquid: Not tested   Puree Puree: Within functional limits Presentation: Self Fed;Spoon (~6 ozs total) Other Comments: shaky UEs; slightly impulsive eating behavior   Solid   GO    Solid: Within functional limits Presentation: Self Fed (assisted - shaky UEs)      Jerilynn SomKatherine Watson, MS,  CCC-SLP Watson,Katherine 06/26/2015,11:15 AM

## 2015-06-26 NOTE — Clinical Social Work Note (Signed)
Late entry 06/26/15 for 06/25/15  Clinical Social Work Assessment  Patient Details  Name: Candice DoomLinda J Sawyer MRN: [id number] Date of Birth: 08/19/1961  Date of referral:  06/25/15               Reason for consult:  Facility Placement                Permission sought to share information with:  Family Supports, Therapist, sportssychiatrist, Oceanographeracility Contact Representative Permission granted to share information::  Yes, Verbal Permission Granted  Name::        Agency::  [facility], [facility], [facility]  Relationship::  Sister  Contact Information:     Housing/Transportation Living arrangements for the past 2 months:  Assisted Living Facility Source of Information:  Patient Patient Interpreter Needed:  None Criminal Activity/Legal Involvement Pertinent to Current Situation/Hospitalization:  No - Comment as needed Significant Relationships:  Mental Health Provider, Siblings Lives with:  Facility Resident Do you feel safe going back to the place where you live?  Yes Need for family participation in patient care:  Yes (Comment)  Care giving concerns:  [facility].    Social Worker assessment / plan:  CSW was referred to Pt to assist with dc planning. Pt is single, no children, shares that her sister is her main support. Pt enjoys living at the family care home but understands that she will need skilled nursing facility at discharge. Pt has been to SNF before and reports that her [facility] is [facility] so she wants to go there as well.  CSW completed FL2 and sent Pt's in formation to SNF for review.    Pt reports hx of schizophrenia which is stable with treatment and supportive housing at this time. [facility] medication management every 3 months. Pt also goes to [facility] day program which she states "is her favorite." CSW informed Pt that she will not be stable enough to [facility]  until [facility] and is independent in her ADL's. Pt accepting of news at this time.   Employment status:  Disabled (Comment on whether or not currently receiving Disability) Insurance information:  [place]:  Skilled Nursing Facility Information / Referral to community resources:  Skilled Nursing Facility  Patient/Family's Response to care:  Pt accepting of plan, hopeful [facility]   Patient/Family's Understanding of and Emotional Response to Diagnosis, Current Treatment, and Prognosis:  Pt asks appropriate questions related to discharge and medical care for PNA.  Pt is engaged with treatment team and motivated for recovery. Sister not at bedside during CSW assessment, though historically supportive of patient.   Emotional Assessment Appearance:  Appears younger than stated age Attitude/Demeanor/Rapport:   (engaged; hopeful for recovery) Affect (typically observed):  Accepting, Flat Orientation:  Oriented to Self, Oriented to Place, Oriented to  Time, Oriented to Situation Alcohol / Substance use:  Never Used Psych involvement (Current and /or in the community):  Outpatient Provider  Discharge Needs  Concerns to be addressed:  Adjustment to Illness Readmission within the last 30 days:  No Current discharge risk:  None Barriers to Discharge:  Continued Medical Work up   [employer]ara N Celvin Taney, LCSW 06/26/2015, 11:45 PM

## 2015-06-26 NOTE — Progress Notes (Signed)
Pt transferred from 1C to ICU this am due to respiratory distress with nonrebreather mask in place; pt currently alert and oriented; on 80% HFNC tolerating well with O2 sats at 96%; vss; adequate uop via foley; per Dr Belia HemanKasa pt to remain NPO except ice chips speech to evaluate pt in the morning; NSR on cardiac monitor; antibiotics added to manage dx of aspiration pneumonia; pt resting well; pts family updated about plan of care and questions answers; pt to remain in ICU as stepdown status per Dr Clovis FredricksonKasa's verbal orders will continue to monitor and assess pt

## 2015-06-27 DIAGNOSIS — J9601 Acute respiratory failure with hypoxia: Secondary | ICD-10-CM

## 2015-06-27 DIAGNOSIS — J690 Pneumonitis due to inhalation of food and vomit: Secondary | ICD-10-CM

## 2015-06-27 LAB — BASIC METABOLIC PANEL
Anion gap: 15 (ref 5–15)
BUN: 33 mg/dL — ABNORMAL HIGH (ref 6–20)
CO2: 34 mmol/L — ABNORMAL HIGH (ref 22–32)
Calcium: 8.6 mg/dL — ABNORMAL LOW (ref 8.9–10.3)
Chloride: 94 mmol/L — ABNORMAL LOW (ref 101–111)
Creatinine, Ser: 1.25 mg/dL — ABNORMAL HIGH (ref 0.44–1.00)
GFR, EST AFRICAN AMERICAN: 55 mL/min — AB (ref >60)
GFR, EST NON AFRICAN AMERICAN: 48 mL/min — AB (ref >60)
GLUCOSE: 149 mg/dL — AB (ref 65–99)
POTASSIUM: 4 mmol/L (ref 3.5–5.1)
Sodium: 143 mmol/L (ref 135–145)

## 2015-06-27 LAB — CBC WITH DIFFERENTIAL/PLATELET
BASOS ABS: 0 10*3/uL (ref 0.0–0.1)
BASOS PCT: 0 % (ref 0–1)
Band Neutrophils: 0 % (ref 0–10)
Blasts: 0 %
EOS ABS: 0 10*3/uL (ref 0.0–0.7)
Eosinophils Relative: 0 % (ref 0–5)
HEMATOCRIT: 29.1 % — AB (ref 35.0–47.0)
Hemoglobin: 9.6 g/dL — ABNORMAL LOW (ref 12.0–16.0)
Lymphocytes Relative: 31 % (ref 12–46)
Lymphs Abs: 3 10*3/uL (ref 0.7–4.0)
MCH: 31.2 pg (ref 26.0–34.0)
MCHC: 33 g/dL (ref 32.0–36.0)
MCV: 94.5 fL (ref 80.0–100.0)
MONOS PCT: 3 % (ref 3–12)
MYELOCYTES: 2 %
Metamyelocytes Relative: 1 %
Monocytes Absolute: 0.3 10*3/uL (ref 0.1–1.0)
NEUTROS PCT: 63 % (ref 43–77)
Neutro Abs: 6.3 10*3/uL (ref 1.7–7.7)
Other: 0 %
PLATELETS: 283 10*3/uL (ref 150–440)
PROMYELOCYTES ABS: 0 %
RBC: 3.08 MIL/uL — ABNORMAL LOW (ref 3.80–5.20)
RDW: 16 % — ABNORMAL HIGH (ref 11.5–14.5)
WBC: 9.6 10*3/uL (ref 3.6–11.0)
nRBC: 0 /100 WBC

## 2015-06-27 LAB — GLUCOSE, CAPILLARY
GLUCOSE-CAPILLARY: 135 mg/dL — AB (ref 65–99)
Glucose-Capillary: 288 mg/dL — ABNORMAL HIGH (ref 65–99)
Glucose-Capillary: 363 mg/dL — ABNORMAL HIGH (ref 65–99)
Glucose-Capillary: 216 mg/dL — ABNORMAL HIGH (ref 65–99)

## 2015-06-27 MED ORDER — INSULIN GLARGINE 100 UNIT/ML ~~LOC~~ SOLN
15.0000 [IU] | Freq: Once | SUBCUTANEOUS | Status: AC
  Administered 2015-06-27: 15 [IU] via SUBCUTANEOUS
  Filled 2015-06-27: qty 0.15

## 2015-06-27 MED ORDER — INSULIN GLARGINE 100 UNIT/ML ~~LOC~~ SOLN
15.0000 [IU] | Freq: Every day | SUBCUTANEOUS | Status: DC
  Filled 2015-06-27 (×2): qty 0.15

## 2015-06-27 NOTE — Progress Notes (Signed)
CRITICAL CARE MEDICINE: ICU PROGRESS NOTE   Name: Candice Sawyer MRN: [id number] DOB: 1960/12/11    ASSessment/Plan.   54 yo white female admitted to ICU for progressive hypoxic resp failure from acute bilateral pneumonia likely from aspiration  PULMONARY -Respiratory Failure,likely due to pneumonia -start Bronchodilator Therapy, continue abx.  -Currently on 70% high flow.  -wean fio2 as tolerated. -high risk for intubation, and may be very difficult to wean if intubated. Given high risk of intubation with bronchoscopy we have been treating empirically at this time.    CARDIOVASCULAR Needs ICU monitoring  RENAL Follow chem 7 follow uo   HEMATOLOGIC Follow h/h  INFECTIOUS Continue abx with vanomycin and zosyn -obtai sputum cultures if possible  ENDOCRINE Follow FSBS  NEUROLOGIC -patient with h/o mental disability, will look to sister HCPOA to make decisions  ---------------------------------------------------  CHIEF COMPLAINT:  dyspnea   SUBJECTIVE:   Pt feels that her breathing is feeling better today.   VITAL SIGNS: Temp:  [97.8 F (36.6 C)-98.4 F (36.9 C)] 98 F (36.7 C) (07/20 0700) Pulse Rate:  [67-93] 93 (07/20 1300) Resp:  [24-34] 34 (07/20 1300) BP: (93-121)/(50-86) 106/75 mmHg (07/20 1300) SpO2:  [88 %-99 %] 95 % (07/20 1300) FiO2 (%):  [60 %-80 %] 70 % (07/20 1010) Weight:  [81.7 kg (180 lb 1.9 oz)] 81.7 kg (180 lb 1.9 oz) (07/20 0500) HEMODYNAMICS:   INTAKE / OUTPUT:  Intake/Output Summary (Last 24 hours) at 06/27/15 1337 Last data filed at 06/27/15 1000  Gross per 24 hour  Intake    770 ml  Output   1410 ml  Net   -640 ml    PHYSICAL EXAMINATION: Physical Examination:   VS: BP 106/75 mmHg  Pulse 93  Temp(Src) 98 F (36.7 C) (Axillary)  Resp 34  Ht 5' (1.524 m)  Wt 81.7 kg (180 lb 1.9 oz)  BMI 35.18 kg/m2  SpO2 95%  LMP  (LMP Unknown)  General Appearance: No distress  Neuro:without focal findings, mental status, speech  normal, alert and oriented, cranial nerves 2-12 intact, reflexes normal and symmetric, sensation grossly normal  HEENT: PERRLA, EOM intact, no ptosis, no other lesions noticed;  Pulmonary: normal breath sounds., diaphragmatic excursion normal.No wheezing.   CardiovascularNormal S1,S2.  No m/r/g.  Abdominal aorta pulsation normal.    Abdomen: Benign, Soft, non-tender, No masses, hepatosplenomegaly, No lymphadenopathy Renal:  No costovertebral tenderness  GU:  No performed at this time. Endoc: No evident thyromegaly, no signs of acromegaly or Cushing features Skin:   warm, no rashes, no ecchymosis  Extremities: normal, no cyanosis, clubbing, no edema, warm with normal capillary refill.    LABS:  CBC  Recent Labs Lab 06/24/15 0637 06/26/15 0448 06/27/15 0434  WBC 7.7 11.4* 9.6  HGB 9.9* 9.9* 9.6*  HCT 29.9* 28.9* 29.1*  PLT 230 262 283   Coag's No results for input(s): APTT, INR in the last 168 hours. BMET  Recent Labs Lab 06/24/15 0637 06/26/15 0448 06/27/15 0434  NA 138 139 143  K 3.7 4.2 4.0  CL 92* 91* 94*  CO2 33* 34* 34*  BUN 14 28* 33*  CREATININE 1.15* 1.16* 1.25*  GLUCOSE 198* 201* 149*   Electrolytes  Recent Labs Lab 06/24/15 0637 06/26/15 0448 06/27/15 0434  CALCIUM 8.4* 8.4* 8.6*   Sepsis Markers No results for input(s): LATICACIDVEN, PROCALCITON, O2SATVEN in the last 168 hours. ABG  Recent Labs Lab 06/26/15 0900  PHART 7.50*  PCO2ART 48  PO2ART 62*   Liver Enzymes  No results for input(s): AST, ALT, ALKPHOS, BILITOT, ALBUMIN in the last 168 hours. Cardiac Enzymes  Recent Labs Lab 06/24/15 0637  TROPONINI <0.03   Glucose  Recent Labs Lab 06/26/15 0733 06/26/15 1132 06/26/15 1622 06/26/15 2153 06/27/15 0716 06/27/15 1123  GLUCAP 201* 250* 294* 193* 135* 288*    Imaging No results found.   Wells Guileseep Holdan Stucke, MD [facility] Pager [phone]- [phone]  Critical Care Attestation.  I have personally  obtained a history, examined the patient, evaluated laboratory and imaging results, formulated the assessment and plan and placed orders. The Patient requires high complexity decision making for assessment and support, frequent evaluation and titration of therapies, application of advanced monitoring technologies and extensive interpretation of multiple databases. The patient has critical illness that could lead imminently to failure of 1 or more organ systems and requires the highest level of physician preparedness to intervene.  Critical Care Time devoted to patient care services described in this note is 35 minutes and is exclusive of time spent in procedures.

## 2015-06-27 NOTE — Therapy (Signed)
Nurse reported desaturation. Patient found with SpO2 86% on FiO2 .60 at 50L. Patient encouraged to cough and deep breathe. SpO2 increased to 87%. FiO2 titrated up to .65 then .70, flow increased to 55L. Dr.Ram aware. Nurse informed of same.

## 2015-06-27 NOTE — Progress Notes (Signed)
ANTIBIOTIC CONSULT NOTE - FOLLOW UP   Pharmacy Consult for Vancomycin and Zosyn Indication: pneumonia  Allergies  Allergen Reactions  . Haloperidol And Related Anaphylaxis  . Stelazine [Trifluoperazine] Anaphylaxis  . Bupropion Other (See Comments)    Reaction:  Unknown   . Losartan Potassium Other (See Comments)    Reaction:  Unknown   . Thorazine [Chlorpromazine] Other (See Comments)    Reaction:  Unknown   . Trazodone And Nefazodone Other (See Comments)    Reaction:  Unknown     Patient Measurements: Height: 5' (152.4 cm) Weight: 180 lb 1.9 oz (81.7 kg) IBW/kg (Calculated) : 45.5 Adjusted Body Weight: 59.7 kg  Vital Signs: Temp: 98 F (36.7 C) (07/20 0700) Temp Source: Axillary (07/20 0432) BP: 119/61 mmHg (07/20 0700) Pulse Rate: 73 (07/20 0700) Intake/Output from previous day: 07/19 0701 - 07/20 0700 In: 1010 [P.O.:360; IV Piggyback:325] Out: 1410 [Urine:1410] Intake/Output from this shift:    Labs:  Recent Labs  06/26/15 0448 06/27/15 0434  WBC 11.4* 9.6  HGB 9.9* 9.6*  PLT 262 283  CREATININE 1.16* 1.25*   Estimated Creatinine Clearance: 48.7 mL/min (by C-G formula based on Cr of 1.25). No results for input(s): VANCOTROUGH, VANCOPEAK, VANCORANDOM, GENTTROUGH, GENTPEAK, GENTRANDOM, TOBRATROUGH, TOBRAPEAK, TOBRARND, AMIKACINPEAK, AMIKACINTROU, AMIKACIN in the last 72 hours.   Microbiology: Recent Results (from the past 720 hour(s))  Urine culture     Status: None   Collection Time: 06/22/15 10:07 AM  Result Value Ref Range Status   Specimen Description URINE, RANDOM  Final   Special Requests NONE  Final   Culture   Final    MULTIPLE SPECIES PRESENT, SUGGEST RECOLLECTION IF CLINICALLY INDICATED   Report Status 06/23/2015 FINAL  Final  Culture, sputum-assessment     Status: None (Preliminary result)   Collection Time: 06/23/15  8:20 PM  Result Value Ref Range Status   Specimen Description EXPECTORATED SPUTUM  Final   Special Requests NONE  Final   Sputum evaluation   Final    Sputum specimen not acceptable for testing.  Please recollect.   Madelaine BhatCALLED CASEY VAUGHANS WARD AT 2102 06/23/15 FOR RECOLLECT.PMH   Report Status PENDING  Incomplete  MRSA PCR Screening     Status: None   Collection Time: 06/26/15 10:18 AM  Result Value Ref Range Status   MRSA by PCR NEGATIVE NEGATIVE Final    Comment:        The GeneXpert MRSA Assay (FDA approved for NASAL specimens only), is one component of a comprehensive MRSA colonization surveillance program. It is not intended to diagnose MRSA infection nor to guide or monitor treatment for MRSA infections.     Medical History: Past Medical History  Diagnosis Date  . Seizures   . Diabetes mellitus without complication   . CHF (congestive heart failure)   . Bipolar 1 disorder   . Schizophrenia   . Constipation   . CAD (coronary artery disease)   . HTN (hypertension)   . Obesity   . Dementia     Medications:  Infusions:   Assessment: [age] yof with acute respiratory failure transferred to ICU for progressive hypoxia, broadening antibiotics to include HCAP organisms.   Goal of Therapy:  Vancomycin trough level 15-20 mcg/ml  Plan:  Expected duration 10 days with resolution of temperature and/or normalization of WBC Measure antibiotic drug levels at steady state Follow up culture results  Imani Fiebelkorn D 06/27/2015,9:33 AM

## 2015-06-27 NOTE — Progress Notes (Signed)
Pt alert and oriented, sleeping majority of shift.  VSS.   NSR, lungs diminished, 65% HFNC.  Pt remains NPO, awaiting speech evaluation to clear.  Pt takes pills with no complications whole in water.  Adequate urine output from foley.  Remains Stepdown status.

## 2015-06-27 NOTE — Therapy (Signed)
FiO2 titrated down to .60 again at 55 L. SpO2 97%. RR 24. Patient is less tachypnic with improved oxygenation.

## 2015-06-27 NOTE — Progress Notes (Signed)
[facility]   PATIENT NAME: Candice SoursLinda Sawyer    MR#:  [id number]  DATE OF BIRTH:  1961/11/30  SUBJECTIVE:  CHIEF COMPLAINT:   Chief Complaint  Patient presents with  . Hyperglycemia  . Weakness  . Cough   Admitted for cough, hyperglycemia. Slowly getting worse with breathing and O2 requirements daily. Ct showed lot of mucous. Now on HFNC. Looks critically ill.  REVIEW OF SYSTEMS:    ROS Unobtainable Dementia   DRUG ALLERGIES:   Allergies  Allergen Reactions  . Haloperidol And Related Anaphylaxis  . Stelazine [Trifluoperazine] Anaphylaxis  . Bupropion Other (See Comments)    Reaction:  Unknown   . Losartan Potassium Other (See Comments)    Reaction:  Unknown   . Thorazine [Chlorpromazine] Other (See Comments)    Reaction:  Unknown   . Trazodone And Nefazodone Other (See Comments)    Reaction:  Unknown     VITALS:  Blood pressure 108/86, pulse 90, temperature 98 F (36.7 C), temperature source Axillary, resp. rate 31, height 5' (1.524 m), weight 81.7 kg (180 lb 1.9 oz), SpO2 88 %.  PHYSICAL EXAMINATION:   Physical Exam  GENERAL:  54 y.o.-year-old patient lying in the bed , looks critically ill. EYES: Pupils equal, round, reactive to light and accommodation. No scleral icterus. Extraocular muscles intact.  HEENT: Head atraumatic, normocephalic. Oropharynx and nasopharynx clear.  NECK:  Supple, no jugular venous distention. No thyroid enlargement, no tenderness.  LUNGS: Bilateral coarse breath sounds. Increased work of breathing. Wheezing bilateral CARDIOVASCULAR : S1, S2 normal. No murmurs, rubs, or gallops.  ABDOMEN: Soft, nontender, nondistended. Bowel sounds present. No organomegaly or mass. Foley EXTREMITIES: No cyanosis, clubbing or edema b/l.    NEUROLOGIC: Cranial nerves II through XII are intact. No focal Motor or sensory deficits b/l.   PSYCHIATRIC: The patient is alert and awake. Flat affect SKIN: No  obvious rash, lesion, or ulcer.    LABORATORY PANEL:   CBC  Recent Labs Lab 06/27/15 0434  WBC 9.6  HGB 9.6*  HCT 29.1*  PLT 283   ------------------------------------------------------------------------------------------------------------------  Chemistries   Recent Labs Lab 06/27/15 0434  NA 143  K 4.0  CL 94*  CO2 34*  GLUCOSE 149*  BUN 33*  CREATININE 1.25*  CALCIUM 8.6*   ------------------------------------------------------------------------------------------------------------------  Cardiac Enzymes  Recent Labs Lab 06/24/15 0637  TROPONINI <0.03   ------------------------------------------------------------------------------------------------------------------  RADIOLOGY:  Ct Chest Wo Contrast  06/25/2015   CLINICAL DATA:  54 year old female with recent hospital admission for cough. Shortness of breath and weakness for 1 week. No improvement of symptoms while hospitalized.  EXAM: CT CHEST WITHOUT CONTRAST  TECHNIQUE: Multidetector CT imaging of the chest was performed following the standard protocol without IV contrast.  COMPARISON:  No priors.  FINDINGS: Mediastinum/Lymph Nodes: Heart size is normal. There is no significant pericardial fluid, thickening or pericardial calcification. Multiple prominent borderline enlarged mediastinal lymph nodes are nonspecific, favored to be reactive. No pathologically enlarged mediastinal or hilar lymph nodes. Please note that accurate exclusion of hilar adenopathy is limited on noncontrast CT scans. Esophagus is unremarkable in appearance. No axillary lymphadenopathy.  Lungs/Pleura: There appears to be a combination of airspace consolidation and volume loss in the lower lobes of the lungs bilaterally, as well as the right middle lobe. Several fluid filled bronchi are noted, particularly on the right side where the bronchus intermedius, right middle lobe and right lower lobe bronchi are all opacified. No pleural effusions. No  definite  suspicious appearing pulmonary nodules or masses.  Upper Abdomen: Unremarkable.  Musculoskeletal/Soft Tissues: There are no aggressive appearing lytic or blastic lesions noted in the visualized portions of the skeleton.  IMPRESSION: 1. There is a combination of airspace consolidation and atelectasis in the lower lobes of the lungs bilaterally and the right middle lobe, with large amount of fluid in the right-sided bronchi, as discussed above. Findings are favored to reflect an aspiration pneumonia.   Electronically Signed   By: Trudie Reed M.D.   On: 06/25/2015 14:52     ASSESSMENT AND PLAN:   51 f with DM, CHF, Schizoaffective disorder here from group home due to cough, hyperglycemia  * Bilateral pneumonia On IV Levaquin. Sent sputum cultures but inadequate sample. Will repeat. Scheduled nebulizers. Ct scan shows lot of mucous and raised concern regarding aspiration. Did well with swallow eval. Start diet.  * Acute respiratory failure, Hypoxic Due to  Pneumonia. On HFNC Critically ill. High risk fro intubation.  * Acute on chronic diastolic chf - Diuresed I/Os. On oral torsemide.  * UTI On IV antibiotics.  * DM, Uncontrolled Home meds. SSI, ADA.  * Seizures On depakote  * AOCD Stable  All the records are reviewed and case discussed with Care Management/Social Workerr. Management plans discussed with the family and they are in agreement.  CODE STATUS: FULL Discussed with HCPOA- Sister. Says quality of life is important. Was on ventilator in the past. Would like short term life support if it helps.  DVT Prophylaxis: SCDs, Lovenox  TOTAL CRITICAL CARE TIME TAKING CARE OF THIS PATIENT: 35 minutes.   Milagros Loll R M.D on 06/23/2015 at 10:27 AM  Between 7am to 6pm - Pager - [phone]  After 6pm go to [email or web address] - password [email or web address] [facility]  [facility]  Office  [phone]  CC: Primary care physician; Derwood Kaplan, MD

## 2015-06-27 NOTE — Progress Notes (Signed)
Inpatient Diabetes Program Recommendations  AACE/ADA: New Consensus Statement on Inpatient Glycemic Control (2013)  Target Ranges:  Prepandial:   less than 140 mg/dL      Peak postprandial:   less than 180 mg/dL (1-2 hours)      Critically ill patients:  140 - 180 mg/dL   Review of glycemic control:  Results for Candice Sawyer, Candice Sawyer (MRN [id number]) as of 06/27/2015 13:22  Ref. Range 06/26/2015 11:32 06/26/2015 16:22 06/26/2015 21:53 06/27/2015 07:16 06/27/2015 11:23  Glucose-Capillary Latest Ref Range: 65-99 mg/dL 478250 (H) 295294 (H) 621193 (H) 135 (H) 288 (H)   Note blood sugars increased at lunch and supper.  May consider adding Novolog 4 units tid with meals (Hold if patients eats less than 50%) while patient is on PO prednisone.  Thanks, Beryl MeagerJenny Jere Bostrom, RN, BC-ADM Inpatient Diabetes Coordinator Pager [phone] (8a-5p)

## 2015-06-27 NOTE — Care Management Note (Signed)
Case Management Note  Patient Details  Name: Candice Sawyer MRN: [id number] Date of Birth: 28-Sep-1961  Subjective/Objective:   Transferred to ICU 07/19 due to progressive respiratory failure. Plan is SNF at discharge, CSW following.                  Action/Plan:   Expected Discharge Date:                  Expected Discharge Plan:     In-House Referral:     Discharge planning Services     Post Acute Care Choice:    Choice offered to:     DME Arranged:    DME Agency:     HH Arranged:    HH Agency:     Status of Service:     Medicare Important Message Given:  Yes-third notification given Date Medicare IM Given:    Medicare IM give by:    Date Additional Medicare IM Given:    Additional Medicare Important Message give by:     If discussed at Long Length of Stay Meetings, dates discussed:    Additional Comments:  Marily MemosLisa M Kyilee Gregg, RN 06/27/2015, 3:13 PM

## 2015-06-27 NOTE — Plan of Care (Signed)
Problem: Discharge Progression Outcomes Goal: Discharge plan in place and appropriate Outcome: Progressing Pt to discharge to skilled nursing facility when medically cleared

## 2015-06-27 NOTE — Care Management Important Message (Signed)
Important Message  Patient Details  Name: Candice DoomLinda J Sawyer MRN: [id number] Date of Birth: [birth year]   Medicare Important Message Given:  Yes-third notification given    Olegario MessierKathy A Allmond 06/27/2015, 9:01 AM

## 2015-06-27 NOTE — Therapy (Signed)
FiO2 decreased to .60  At 50L. SpO2 96%

## 2015-06-27 NOTE — Progress Notes (Signed)
Speech Language Pathology Treatment: Dysphagia  Patient Details Name: Candice DoomLinda J Sawyer MRN: [id number] DOB: 1961-03-05 Today's Date: 06/27/2015 Time: 0900-1000 SLP Time Calculation (min) (ACUTE ONLY): 60 min  Assessment / Plan / Recommendation Clinical Impression  Pt appeared to tolerate trials of thin liquids, purees, and soft solid(pancake - 7 trials) w/ no overt s/s of aspiration noted; no change in vocal quality post trials, no decline in respiratory status/change of breathing w/ trials or b/t trials, no decline in vital signs. Pt consumed po meds whole in puree w/out deficits or decline in status noted w/ NSG present. Pt's oral phase of swallowing was grossly wfl; pt exhibited slightly impulsive drinking behavior as she drank but suspect this could be baseline behavior for pt sec. to her Cognitive status; sister stated pt ate "quickly" too and she felt pt "should slow down". Pt was assisted w/ drinking and eating d/t shaky UEs holding the cup and weakness for feeding self. Sister stated pt's Primary MD (Dr. Maryellen PileEason) had stated pt had "Parkinson's Dis." -  this was relayed to her current MD who will f/u w/ this as this type of dx could impact swallowing. Consulted NSG, then MD, re: pt's status and need for aspiration precautions and assistance at meals sec. to respiratory status and O2 needs at this time. Rec. meds in puree for easier swallowing currently. NSG agreed. Will f/u next 1-2 days.   HPI Other Pertinent Information: Pt is a 54 y.o. female with a known history of CHF, seizures, CAD, diabetes mellitus, dementia, schizophrenia, bipolar dis., Obesity who presents from group home with complaints of weakness, pleuritic chest pain and cough. Some hyperglycemia. Recently started on new diabetes medication by PCP. She was been found to have bibasilar pneumonia along with a urinary tract infection. Saturations on room air 85% at admission. Currently, pt's O2 support was increased last evening and her O2  sats remain ~80%, per RT. Pt is on a regular diet consistency since admission per MD order. Pt and NSG deny any s/s of aspiration w/ po meds, diet this AM. Pt answers basic questions re: self w/ limited details and "yes/no" answers; suspect this could be related to her baseline Cognitive status. pt was transferred to CCU yesterday d/t decline in respiratory status and made NPO. NSG gave po meds w/ small sips of water w/ no overt s/s of aspiration noted last night/this morning. Pt is on 60-65% FiO2 support via HFNC. She has a significant amount of mucous in the airway/lungs.    Pertinent Vitals Pain Assessment: No/denies pain  SLP Plan  Continue with current plan of care    Recommendations Diet recommendations: Dysphagia 2 (fine chop);Thin liquid Liquids provided via: Cup;Straw Medication Administration: Whole meds with puree Supervision: Full supervision/cueing for compensatory strategies;Trained caregiver to feed patient Compensations: Slow rate;Small sips/bites (give time for full mastication then clearing) Postural Changes and/or Swallow Maneuvers: Seated upright 90 degrees              General recommendations:  (Dietician) Oral Care Recommendations: Staff/trained caregiver to provide oral care;Oral care BID;Oral care before and after PO Follow up Recommendations: Skilled Nursing facility Plan: Continue with current plan of care    GO Functional Limitations:  (Cognitive status)   Jerilynn SomKatherine Brook Mall, MS, CCC-SLP Jun Osment 06/27/2015, 2:36 PM

## 2015-06-28 ENCOUNTER — Inpatient Hospital Stay: Payer: Medicare Other

## 2015-06-28 LAB — CBC
HCT: 30.4 % — ABNORMAL LOW (ref 35.0–47.0)
HEMOGLOBIN: 10 g/dL — AB (ref 12.0–16.0)
MCH: 31 pg (ref 26.0–34.0)
MCHC: 32.9 g/dL (ref 32.0–36.0)
MCV: 94.2 fL (ref 80.0–100.0)
Platelets: 295 10*3/uL (ref 150–440)
RBC: 3.23 MIL/uL — ABNORMAL LOW (ref 3.80–5.20)
RDW: 15.4 % — ABNORMAL HIGH (ref 11.5–14.5)
WBC: 10.5 10*3/uL (ref 3.6–11.0)

## 2015-06-28 LAB — BASIC METABOLIC PANEL
ANION GAP: 11 (ref 5–15)
BUN: 28 mg/dL — ABNORMAL HIGH (ref 6–20)
CALCIUM: 8.6 mg/dL — AB (ref 8.9–10.3)
CO2: 33 mmol/L — ABNORMAL HIGH (ref 22–32)
Chloride: 94 mmol/L — ABNORMAL LOW (ref 101–111)
Creatinine, Ser: 1.05 mg/dL — ABNORMAL HIGH (ref 0.44–1.00)
GFR calc Af Amer: >60 mL/min (ref >60)
GFR calc non Af Amer: 59 mL/min — ABNORMAL LOW (ref >60)
Glucose, Bld: 208 mg/dL — ABNORMAL HIGH (ref 65–99)
Potassium: 3.9 mmol/L (ref 3.5–5.1)
Sodium: 138 mmol/L (ref 135–145)

## 2015-06-28 LAB — VANCOMYCIN, TROUGH: Vancomycin Tr: 21 ug/mL (ref 10–20)

## 2015-06-28 LAB — GLUCOSE, CAPILLARY
GLUCOSE-CAPILLARY: 332 mg/dL — AB (ref 65–99)
GLUCOSE-CAPILLARY: 472 mg/dL — AB (ref 65–99)
GLUCOSE-CAPILLARY: 513 mg/dL — AB (ref 65–99)
Glucose-Capillary: 178 mg/dL — ABNORMAL HIGH (ref 65–99)
Glucose-Capillary: 431 mg/dL — ABNORMAL HIGH (ref 65–99)

## 2015-06-28 MED ORDER — VANCOMYCIN HCL IN DEXTROSE 1-5 GM/200ML-% IV SOLN
1000.0000 mg | INTRAVENOUS | Status: DC
  Administered 2015-06-28 – 2015-06-29 (×2): 1000 mg via INTRAVENOUS
  Filled 2015-06-28 (×3): qty 200

## 2015-06-28 MED ORDER — INSULIN ASPART 100 UNIT/ML ~~LOC~~ SOLN
10.0000 [IU] | Freq: Once | SUBCUTANEOUS | Status: AC
  Administered 2015-06-29: 10 [IU] via SUBCUTANEOUS
  Filled 2015-06-28: qty 10

## 2015-06-28 MED ORDER — INSULIN GLARGINE 100 UNIT/ML ~~LOC~~ SOLN
10.0000 [IU] | Freq: Once | SUBCUTANEOUS | Status: AC
  Administered 2015-06-28: 10 [IU] via SUBCUTANEOUS
  Filled 2015-06-28: qty 0.1

## 2015-06-28 NOTE — Progress Notes (Signed)
CRITICAL CARE MEDICINE: ICU PROGRESS NOTE   Name: Candice Sawyer MRN: [id number] DOB: 1961-06-27    ASSessment/Plan.   54 yo white female admitted to ICU for progressive hypoxic resp failure from acute bilateral pneumonia likely from aspiration  PULMONARY -Respiratory Failure,likely due to pneumonia -continue Bronchodilator Therapy, continue abx.  -Currently on high flow, wean as tolerated.  -wean fio2 as tolerated. -high risk for intubation, and may be very difficult to wean if intubated. Given high risk of intubation with bronchoscopy we have been treating empirically at this time.    CARDIOVASCULAR Needs ICU monitoring  RENAL Follow chem 7 follow uo   HEMATOLOGIC Follow h/h  INFECTIOUS Continue abx with vanomycin and zosyn -obtai sputum cultures if possible  ENDOCRINE Follow FSBS  NEUROLOGIC -patient with h/o mental disability, will look to sister HCPOA to make decisions  ---------------------------------------------------  CHIEF COMPLAINT:  dyspnea   SUBJECTIVE:   Pt feels that her breathing is feeling better today.   VITAL SIGNS: Temp:  [97.8 F (36.6 C)-98.9 F (37.2 C)] 98.9 F (37.2 C) (07/21 0400) Pulse Rate:  [62-79] 63 (07/21 0500) Resp:  [19-30] 23 (07/21 0500) BP: (110-132)/(49-72) 132/72 mmHg (07/21 0500) SpO2:  [97 %-100 %] 100 % (07/21 0500) FiO2 (%):  [60 %] 60 % (07/20 1951) Weight:  [82.6 kg (182 lb 1.6 oz)] 82.6 kg (182 lb 1.6 oz) (07/21 0502) HEMODYNAMICS:   INTAKE / OUTPUT:  Intake/Output Summary (Last 24 hours) at 06/28/15 1706 Last data filed at 06/28/15 0900  Gross per 24 hour  Intake    280 ml  Output   2600 ml  Net  -2320 ml    PHYSICAL EXAMINATION: Physical Examination:   VS: BP 132/72 mmHg  Pulse 63  Temp(Src) 98.9 F (37.2 C) (Axillary)  Resp 23  Ht 5' (1.524 m)  Wt 82.6 kg (182 lb 1.6 oz)  BMI 35.56 kg/m2  SpO2 100%  LMP  (LMP Unknown)  General Appearance: No distress  Neuro:without focal findings,  mental status, speech normal, alert and oriented, cranial nerves 2-12 intact, reflexes normal and symmetric, sensation grossly normal  HEENT: PERRLA, EOM intact, no ptosis, no other lesions noticed;  Pulmonary: normal breath sounds., diaphragmatic excursion normal.No wheezing.   CardiovascularNormal S1,S2.  No m/r/g.  Abdominal aorta pulsation normal.    Abdomen: Benign, Soft, non-tender, No masses, hepatosplenomegaly, No lymphadenopathy Renal:  No costovertebral tenderness  GU:  No performed at this time. Endoc: No evident thyromegaly, no signs of acromegaly or Cushing features Skin:   warm, no rashes, no ecchymosis  Extremities: normal, no cyanosis, clubbing, no edema, warm with normal capillary refill.    LABS:  CBC  Recent Labs Lab 06/26/15 0448 06/27/15 0434 06/28/15 0506  WBC 11.4* 9.6 10.5  HGB 9.9* 9.6* 10.0*  HCT 28.9* 29.1* 30.4*  PLT 262 283 295   Coag's No results for input(s): APTT, INR in the last 168 hours. BMET  Recent Labs Lab 06/26/15 0448 06/27/15 0434 06/28/15 0506  NA 139 143 138  K 4.2 4.0 3.9  CL 91* 94* 94*  CO2 34* 34* 33*  BUN 28* 33* 28*  CREATININE 1.16* 1.25* 1.05*  GLUCOSE 201* 149* 208*   Electrolytes  Recent Labs Lab 06/26/15 0448 06/27/15 0434 06/28/15 0506  CALCIUM 8.4* 8.6* 8.6*   Sepsis Markers No results for input(s): LATICACIDVEN, PROCALCITON, O2SATVEN in the last 168 hours. ABG  Recent Labs Lab 06/26/15 0900  PHART 7.50*  PCO2ART 48  PO2ART 62*   Liver Enzymes  No results for input(s): AST, ALT, ALKPHOS, BILITOT, ALBUMIN in the last 168 hours. Cardiac Enzymes  Recent Labs Lab 06/24/15 0637  TROPONINI <0.03   Glucose  Recent Labs Lab 06/27/15 0716 06/27/15 1123 06/27/15 1550 06/27/15 2044 06/28/15 0726 06/28/15 1545  GLUCAP 135* 288* 363* 216* 178* 513*    Imaging Dg Chest 1 View  06/28/2015   CLINICAL DATA:  Acute respiratory failure, pneumonia, CHF  EXAM: CHEST  1 VIEW  COMPARISON:  Portable  chest x-ray of June 24, 2015 and CT scan of the chest of June 25, 2015  FINDINGS: The lung volumes remain low. The hemidiaphragms remain obscured. The cardiac silhouette is mildly enlarged though stable. The pulmonary vascularity is normal. The trachea is midline. The bony thorax exhibits no acute abnormality.  IMPRESSION: Persistent bilateral hypoinflation with bibasilar atelectasis or pneumonia and small pleural effusions. There has not been significant interval change since the previous study.   Electronically Signed   By: David  [place] M.D.   On: 06/28/2015 07:26     Deep Nicholos Johns, MD [facility] Pager - [phone]  Critical Care Attestation.  I have personally obtained a history, examined the patient, evaluated laboratory and imaging results, formulated the assessment and plan and placed orders. The Patient requires high complexity decision making for assessment and support, frequent evaluation and titration of therapies, application of advanced monitoring technologies and extensive interpretation of multiple databases. The patient has critical illness that could lead imminently to failure of 1 or more organ systems and requires the highest level of physician preparedness to intervene.  Critical Care Time devoted to patient care services described in this note is 35 minutes and is exclusive of time spent in procedures.

## 2015-06-28 NOTE — Progress Notes (Signed)
ANTIBIOTIC CONSULT NOTE - FOLLOW UP  Pharmacy Consult for Vancomycn Indication: pneumonia  Allergies  Allergen Reactions  . Haloperidol And Related Anaphylaxis  . Stelazine [Trifluoperazine] Anaphylaxis  . Bupropion Other (See Comments)    Reaction:  Unknown   . Losartan Potassium Other (See Comments)    Reaction:  Unknown   . Thorazine [Chlorpromazine] Other (See Comments)    Reaction:  Unknown   . Trazodone And Nefazodone Other (See Comments)    Reaction:  Unknown     Patient Measurements: Height: 5' (152.4 cm) Weight: 182 lb 1.6 oz (82.6 kg) IBW/kg (Calculated) : 45.5  Vital Signs: Temp: 97.9 F (36.6 C) (07/21 1600) Temp Source: Oral (07/21 1600) BP: 144/117 mmHg (07/21 1600) Pulse Rate: 83 (07/21 1600) Intake/Output from previous day: 07/20 0701 - 07/21 0700 In: 1120 [P.O.:720; IV Piggyback:400] Out: 2600 [Urine:2600] Intake/Output from this shift: Total I/O In: 880 [P.O.:830; IV Piggyback:50] Out: 650 [Urine:650]  Labs:  Recent Labs  06/26/15 0448 06/27/15 0434 06/28/15 0506  WBC 11.4* 9.6 10.5  HGB 9.9* 9.6* 10.0*  PLT 262 283 295  CREATININE 1.16* 1.25* 1.05*   Estimated Creatinine Clearance: 58.3 mL/min (by C-G formula based on Cr of 1.05).  Recent Labs  06/28/15 1732  VANCOTROUGH 21*     Microbiology: Recent Results (from the past 720 hour(s))  Urine culture     Status: None   Collection Time: 06/22/15 10:07 AM  Result Value Ref Range Status   Specimen Description URINE, RANDOM  Final   Special Requests NONE  Final   Culture   Final    MULTIPLE SPECIES PRESENT, SUGGEST RECOLLECTION IF CLINICALLY INDICATED   Report Status 06/23/2015 FINAL  Final  Culture, sputum-assessment     Status: None (Preliminary result)   Collection Time: 06/23/15  8:20 PM  Result Value Ref Range Status   Specimen Description EXPECTORATED SPUTUM  Final   Special Requests NONE  Final   Sputum evaluation   Final    Sputum specimen not acceptable for testing.   Please recollect.   Madelaine Bhat WARD AT 2102 06/23/15 FOR RECOLLECT.PMH   Report Status PENDING  Incomplete  MRSA PCR Screening     Status: None   Collection Time: 06/26/15 10:18 AM  Result Value Ref Range Status   MRSA by PCR NEGATIVE NEGATIVE Final    Comment:        The GeneXpert MRSA Assay (FDA approved for NASAL specimens only), is one component of a comprehensive MRSA colonization surveillance program. It is not intended to diagnose MRSA infection nor to guide or monitor treatment for MRSA infections.     Anti-infectives    Start     Dose/Rate Route Frequency Ordered Stop   06/28/15 2000  vancomycin (VANCOCIN) IVPB 1000 mg/200 mL premix     1,000 mg 200 mL/hr over 60 Minutes Intravenous Every 18 hours 06/28/15 1816     06/27/15 0600  vancomycin (VANCOCIN) 1,250 mg in sodium chloride 0.9 % 250 mL IVPB  Status:  Discontinued     1,250 mg 166.7 mL/hr over 90 Minutes Intravenous Every 18 hours 06/26/15 1159 06/28/15 1816   06/26/15 1400  piperacillin-tazobactam (ZOSYN) IVPB 3.375 g     3.375 g 12.5 mL/hr over 240 Minutes Intravenous 3 times per day 06/26/15 1159     06/26/15 1200  vancomycin (VANCOCIN) 1,500 mg in sodium chloride 0.9 % 500 mL IVPB     1,500 mg 250 mL/hr over 120 Minutes Intravenous  Once 06/26/15 1159  06/26/15 1601   06/25/15 1400  levofloxacin (LEVAQUIN) IVPB 750 mg  Status:  Discontinued     750 mg 100 mL/hr over 90 Minutes Intravenous Every 24 hours 06/25/15 1305 06/26/15 1152   06/22/15 1315  levofloxacin (LEVAQUIN) IVPB 750 mg     750 mg 100 mL/hr over 90 Minutes Intravenous  Once 06/22/15 1309 06/22/15 1656      Assessment: 54 yof with acute respiratory failure transferred to ICU for progressive hypoxia, broadening antibiotics to include HCAP organisms.   Vancomycin trough is slightly above goal, however not at steady state.   Goal of Therapy:  Vancomycin trough level 15-20 mcg/ml  Plan:  Will decrease vancomycin to 1000 mg iv q 18  hours and check a trough with the 4th dose.   Luisa Hart D 06/28/2015,6:18 PM

## 2015-06-28 NOTE — Progress Notes (Signed)
Initial Nutrition Assessment    INTERVENTION:   1) Meals and Snacks: Cater to patient preferences; pt does not like [place], misc order placed in computer   NUTRITION DIAGNOSIS:   No nutrition diagnosis at this time  GOAL:   Patient will meet greater than or equal to 90% of their needs   MONITOR:    (Energy Intake, Anthropometrics, Electrolyte/Renal Profile, Glucose Profile)  REASON FOR ASSESSMENT:   LOS    ASSESSMENT:    Pt admitted with bibasilar pneumonia, on HFNC  Past Medical History  Diagnosis Date  . Seizures   . Diabetes mellitus without complication   . CHF (congestive heart failure)   . Bipolar 1 disorder   . Schizophrenia   . Constipation   . CAD (coronary artery disease)   . HTN (hypertension)   . Obesity   . Dementia     Diet Order:  DIET DYS 2 Room service appropriate?: Yes with Assist; Fluid consistency:: Thin with Carb Modified; SLP following  Energy Intake:  Pt reports good appetite, ate good breakfast this AM, Asking what's for lunch on visit today. Recorded po intake 71% of meals on average  Skin:  Reviewed, no issues   Electrolyte and Renal Profile:  Recent Labs Lab 06/26/15 0448 06/27/15 0434 06/28/15 0506  BUN 28* 33* 28*  CREATININE 1.16* 1.25* 1.05*  NA 139 143 138  K 4.2 4.0 3.9   Glucose Profile:   Recent Labs  06/27/15 1550 06/27/15 2044 06/28/15 0726  GLUCAP 363* 216* 178*   Protein Profile: No results for input(s): ALBUMIN in the last 168 hours.  Meds: ss novolog, colace, lantus, potassium chloride  Height:   Ht Readings from Last 1 Encounters:  06/22/15 5' (1.524 m)    Weight: Pt reports weight has been stable  Wt Readings from Last 1 Encounters:  06/28/15 182 lb 1.6 oz (82.6 kg)    Filed Weights   06/26/15 0500 06/27/15 0500 06/28/15 0502  Weight: 178 lb 9.6 oz (81.012 kg) 180 lb 1.9 oz (81.7 kg) 182 lb 1.6 oz (82.6 kg)    BMI:  Body mass index is 35.56 kg/(m^2).  LOW Care Level  Candice Starcher  MS, [place], LDN [phone] Pager

## 2015-06-28 NOTE — Progress Notes (Signed)
[facility] Physicians - [place] at [facility]   PATIENT NAME: Candice Sawyer    MR#:  [id number]  DATE OF BIRTH:  12/08/1961  SUBJECTIVE:  CHIEF COMPLAINT:   Chief Complaint  Patient presents with  . Hyperglycemia  . Weakness  . Cough   Admitted for cough, hyperglycemia. Bilateral pneumonia. Ct showed lot of mucous. Now on HFNC 60% . Looks critically ill. Poor historian.  REVIEW OF SYSTEMS:    ROS Unobtainable Dementia   DRUG ALLERGIES:   Allergies  Allergen Reactions  . Haloperidol And Related Anaphylaxis  . Stelazine [Trifluoperazine] Anaphylaxis  . Bupropion Other (See Comments)    Reaction:  Unknown   . Losartan Potassium Other (See Comments)    Reaction:  Unknown   . Thorazine [Chlorpromazine] Other (See Comments)    Reaction:  Unknown   . Trazodone And Nefazodone Other (See Comments)    Reaction:  Unknown     VITALS:  Blood pressure 132/72, pulse 63, temperature 98.9 F (37.2 C), temperature source Axillary, resp. rate 23, height 5' (1.524 m), weight 82.6 kg (182 lb 1.6 oz), SpO2 100 %.  PHYSICAL EXAMINATION:   Physical Exam  GENERAL:  54 y.o.-year-old patient lying in the bed , looks critically ill. EYES: Pupils equal, round, reactive to light and accommodation. No scleral icterus. Extraocular muscles intact.  HEENT: Head atraumatic, normocephalic. Oropharynx and nasopharynx clear.  NECK:  Supple, no jugular venous distention. No thyroid enlargement, no tenderness.  LUNGS: Bilateral coarse breath sounds. Increased work of breathing. Wheezing bilateral CARDIOVASCULAR : S1, S2 normal. No murmurs, rubs, or gallops.  ABDOMEN: Soft, nontender, nondistended. Bowel sounds present. No organomegaly or mass. Foley EXTREMITIES: No cyanosis, clubbing or edema b/l.    NEUROLOGIC: Cranial nerves II through XII are intact. No focal Motor or sensory deficits b/l.   PSYCHIATRIC: The patient is alert and awake. Flat affect SKIN: No obvious rash, lesion,  or ulcer.    LABORATORY PANEL:   CBC  Recent Labs Lab 06/28/15 0506  WBC 10.5  HGB 10.0*  HCT 30.4*  PLT 295   ------------------------------------------------------------------------------------------------------------------  Chemistries   Recent Labs Lab 06/28/15 0506  NA 138  K 3.9  CL 94*  CO2 33*  GLUCOSE 208*  BUN 28*  CREATININE 1.05*  CALCIUM 8.6*   ------------------------------------------------------------------------------------------------------------------  Cardiac Enzymes  Recent Labs Lab 06/24/15 0637  TROPONINI <0.03   ------------------------------------------------------------------------------------------------------------------  RADIOLOGY:  Dg Chest 1 View  06/28/2015   CLINICAL DATA:  Acute respiratory failure, pneumonia, CHF  EXAM: CHEST  1 VIEW  COMPARISON:  Portable chest x-ray of June 24, 2015 and CT scan of the chest of June 25, 2015  FINDINGS: The lung volumes remain low. The hemidiaphragms remain obscured. The cardiac silhouette is mildly enlarged though stable. The pulmonary vascularity is normal. The trachea is midline. The bony thorax exhibits no acute abnormality.  IMPRESSION: Persistent bilateral hypoinflation with bibasilar atelectasis or pneumonia and small pleural effusions. There has not been significant interval change since the previous study.   Electronically Signed   By: David  [place]aziland M.D.   On: 06/28/2015 07:26     ASSESSMENT AND PLAN:   23 f with DM, CHF, Schizoaffective disorder here from group home due to cough, hyperglycemia  * Bilateral pneumonia On IV Levaquin. Sent sputum cultures but inadequate sample. Will repeat. Scheduled nebulizers. Ct scan shows lot of mucous and raised concern regarding aspiration. Did well with swallow eval. Started on diet.  * Acute respiratory failure, Hypoxic - worsening.  On HFNC 60% Due to  Pneumonia. On HFNC Critically ill. High risk fro intubation. May need bronchoscopy if  not improving.  * Acute on chronic diastolic chf - Diuresed I/Os. On oral torsemide.  * UTI On IV antibiotics.  * DM, Uncontrolled Home meds. SSI, ADA.  * Seizures On depakote  * AOCD Stable  All the records are reviewed and case discussed with Care Management/Social Workerr. Management plans discussed with Sister(HCPOA) and in agreement.  CODE STATUS: FULL   DVT Prophylaxis: SCDs, Lovenox  TOTAL CRITICAL CARE TIME TAKING CARE OF THIS PATIENT: 35 minutes.   Milagros Loll R M.D on 06/23/2015 at 10:20 AM  Between 7am to 6pm - Pager - [phone]  After 6pm go to [email or web address] - password [email or web address] [facility]  [place] [facility]  Office  [phone]  CC: Primary care physician; Derwood Kaplan, MD

## 2015-06-28 NOTE — Progress Notes (Signed)
Pt's blood sugar 332. Notified Dr. Sheryle Hail. Order for 10 units of Novolog insulin given. Will continue to monitor

## 2015-06-28 NOTE — Progress Notes (Signed)
Inpatient Diabetes Program Recommendations  AACE/ADA: New Consensus Statement on Inpatient Glycemic Control (2013)  Target Ranges:  Prepandial:   less than 140 mg/dL      Peak postprandial:   less than 180 mg/dL (1-2 hours)      Critically ill patients:  140 - 180 mg/dL   Diabetes history: Type 2 diabetes Outpatient Diabetes medications: Tradjenta 5 mg daily, Metformin 1000 mg bid,  Current orders for Inpatient glycemic control:  Novolog moderate tid with meals and HS, Tradjenta 5 mg daily, Metformin 1000 mg bid, Lantus 15 units qday,    Note- post prandial blood sugars are elevated and patient is only eating 50 % of her meals- Please consider ordering Novolog 4 units tid with meals (Hold if patient eats less than 50%) while in the hospital.  Susette Racer, RN, BA, MHA, CDE Diabetes Coordinator Inpatient Diabetes Program  [phone] (Team Pager) [phone] [facility] Office) 06/28/2015 8:00 AM

## 2015-06-28 NOTE — Care Management Note (Signed)
Case Management Note  Patient Details  Name: Candice Sawyer MRN: [id number] Date of Birth: 28-Dec-1960  Subjective/Objective:   Discussed LTACH with Dr. Elpidio Anis. Will proceed with having patient evaluated by [facility].                 Action/Plan:   Expected Discharge Date:                  Expected Discharge Plan:     In-House Referral:     Discharge planning Services     Post Acute Care Choice:    Choice offered to:     DME Arranged:    DME Agency:     HH Arranged:    HH Agency:     Status of Service:     Medicare Important Message Given:  Yes-third notification given Date Medicare IM Given:    Medicare IM give by:    Date Additional Medicare IM Given:    Additional Medicare Important Message give by:     If discussed at Long Length of Stay Meetings, dates discussed:    Additional Comments:  Marily Memos, RN 06/28/2015, 1:08 PM

## 2015-06-28 NOTE — Progress Notes (Signed)
Spoke with Dr. Sheryle Hail about Pt's elevated blood sugar even after several doses of Novolog insulin from previous shift and 10 units of Lantus. Orders to recheck blood sugar at midnight were given and to hold 2200 Novolog. Will continue to monitor.

## 2015-06-29 ENCOUNTER — Ambulatory Visit (HOSPITAL_COMMUNITY)
Admission: AD | Admit: 2015-06-29 | Discharge: 2015-06-29 | Disposition: A | Discharge status: Home / Self Care | Payer: Medicare Other | Source: Other Acute Inpatient Hospital | Attending: Internal Medicine | Admitting: Internal Medicine

## 2015-06-29 DIAGNOSIS — E669 Obesity, unspecified: Secondary | ICD-10-CM | POA: Diagnosis present

## 2015-06-29 DIAGNOSIS — J9811 Atelectasis: Secondary | ICD-10-CM

## 2015-06-29 DIAGNOSIS — F203 Undifferentiated schizophrenia: Secondary | ICD-10-CM

## 2015-06-29 DIAGNOSIS — J189 Pneumonia, unspecified organism: Secondary | ICD-10-CM | POA: Insufficient documentation

## 2015-06-29 DIAGNOSIS — F319 Bipolar disorder, unspecified: Secondary | ICD-10-CM

## 2015-06-29 DIAGNOSIS — F209 Schizophrenia, unspecified: Secondary | ICD-10-CM | POA: Diagnosis present

## 2015-06-29 DIAGNOSIS — R739 Hyperglycemia, unspecified: Secondary | ICD-10-CM

## 2015-06-29 LAB — CBC WITH DIFFERENTIAL/PLATELET
BASOS ABS: 0 10*3/uL (ref 0.0–0.1)
BLASTS: 0 %
Band Neutrophils: 0 % (ref 0–10)
Basophils Relative: 0 % (ref 0–1)
EOS ABS: 0 10*3/uL (ref 0.0–0.7)
Eosinophils Relative: 0 % (ref 0–5)
HCT: 30.1 % — ABNORMAL LOW (ref 35.0–47.0)
Hemoglobin: 10 g/dL — ABNORMAL LOW (ref 12.0–16.0)
LYMPHS ABS: 3.5 10*3/uL (ref 0.7–4.0)
Lymphocytes Relative: 36 % (ref 12–46)
MCH: 31.1 pg (ref 26.0–34.0)
MCHC: 33.2 g/dL (ref 32.0–36.0)
MCV: 93.5 fL (ref 80.0–100.0)
MONO ABS: 0.7 10*3/uL (ref 0.1–1.0)
MYELOCYTES: 2 %
Metamyelocytes Relative: 2 %
Monocytes Relative: 7 % (ref 3–12)
NEUTROS ABS: 5.6 10*3/uL (ref 1.7–7.7)
NEUTROS PCT: 53 % (ref 43–77)
NRBC: 0 /100{WBCs}
OTHER: 0 %
PLATELETS: 315 10*3/uL (ref 150–440)
Promyelocytes Absolute: 0 %
RBC: 3.22 MIL/uL — ABNORMAL LOW (ref 3.80–5.20)
RDW: 15.1 % — AB (ref 11.5–14.5)
WBC: 9.8 10*3/uL (ref 3.6–11.0)

## 2015-06-29 LAB — GLUCOSE, CAPILLARY
GLUCOSE-CAPILLARY: 231 mg/dL — AB (ref 65–99)
GLUCOSE-CAPILLARY: 347 mg/dL — AB (ref 65–99)
GLUCOSE-CAPILLARY: 229 mg/dL — AB (ref 65–99)
Glucose-Capillary: 146 mg/dL — ABNORMAL HIGH (ref 65–99)
Glucose-Capillary: 202 mg/dL — ABNORMAL HIGH (ref 65–99)

## 2015-06-29 LAB — CREATININE, SERUM
Creatinine, Ser: 1.18 mg/dL — ABNORMAL HIGH (ref 0.44–1.00)
GFR, EST AFRICAN AMERICAN: 59 mL/min — AB (ref >60)
GFR, EST NON AFRICAN AMERICAN: 51 mL/min — AB (ref >60)

## 2015-06-29 NOTE — Progress Notes (Signed)
[facility] arrived at 2155 to transport patient to [facility].  VSS. Afebrile. No complaints of pain.  Report given to [facility].  Transfer papers given to [facility].  Patient en route as of 2210.

## 2015-06-29 NOTE — Assessment & Plan Note (Signed)
--  Has reduced lung volumes likely made worse by obesity.  --Likely contributing to pneumonia and slow recovery.  --Will increase movement, sit up in chair, consult PT.

## 2015-06-29 NOTE — Assessment & Plan Note (Signed)
--  Likely health care associated pneumonia, continue empiric coverage with broad spectrum.

## 2015-06-29 NOTE — Assessment & Plan Note (Addendum)
--  Secondary to pneumonia.  --Very slow but progressive improvement. Hi-flow oxygen has been weaned down to 60%.  --Pt appears comfortable and does not appear to be in respiratory distress.  --Will attempt to advance activity today, will consult PT, and have patient sit up in chair to improve lung mechanics.  --OK to transfer to [facility], current in process.

## 2015-06-29 NOTE — Progress Notes (Signed)
Inpatient Diabetes Program Recommendations  AACE/ADA: New Consensus Statement on Inpatient Glycemic Control (2013)  Target Ranges:  Prepandial:   less than 140 mg/dL      Peak postprandial:   less than 180 mg/dL (1-2 hours)      Critically ill patients:  140 - 180 mg/dL   Spoke to Dr. Elpidio Anis regarding elevated CBG at 1321 - blood sugar /dl-  Patient received 11 units of Novolog as ordered  Patient had previously been ordered and was given Lantus 10 units last night- FBS was /dl this am. Noted that prednisone has been stopped.   Please consider restarting Lantus 10 units qhs.   Susette Racer, RN, BA, MHA, CDE Diabetes Coordinator Inpatient Diabetes Program  [phone] (Team Pager) [phone] [facility] Office) 06/29/2015 2:32 PM

## 2015-06-29 NOTE — Care Management Note (Signed)
Case Management Note  Patient Details  Name: Candice Sawyer MRN: [id number] Date of Birth: 1961/03/18  Subjective/Objective:   Met with sister late yesterday evening. Discussed LTACH. She is agreeable. [facility] has accepted patient. Remains on HFNC. Bed available when medically stable.    Action/Plan: LTACH/[facility]  Expected Discharge Date:                  Expected Discharge Plan:     In-House Referral:     Discharge planning Services     Post Acute Care Choice:    Choice offered to:     DME Arranged:    DME Agency:     HH Arranged:    [place] Agency:     Status of Service:     Medicare Important Message Given:  Yes-third notification given Date Medicare IM Given:    Medicare IM give by:    Date Additional Medicare IM Given:    Additional Medicare Important Message give by:     If discussed at [place] of Stay Meetings, dates discussed:    Additional Comments:  Jolly Mango, RN 06/29/2015, 9:05 AM

## 2015-06-29 NOTE — Progress Notes (Signed)
[facility] Physicians - [place] at [facility]   PATIENT NAME: Candice Sawyer    MR#:  [id number]  DATE OF BIRTH:  1961/04/28  SUBJECTIVE:  CHIEF COMPLAINT:   Chief Complaint  Patient presents with  . Hyperglycemia  . Weakness  . Cough   Admitted for cough, hyperglycemia. Bilateral pneumonia. Ct showed lot of mucous. Now on HFNC 75% . Looks critically ill. Poor historian.  REVIEW OF SYSTEMS:    ROS Unobtainable Dementia   DRUG ALLERGIES:   Allergies  Allergen Reactions  . Haloperidol And Related Anaphylaxis  . Stelazine [Trifluoperazine] Anaphylaxis  . Bupropion Other (See Comments)    Reaction:  Unknown   . Losartan Potassium Other (See Comments)    Reaction:  Unknown   . Thorazine [Chlorpromazine] Other (See Comments)    Reaction:  Unknown   . Trazodone And Nefazodone Other (See Comments)    Reaction:  Unknown     VITALS:  Blood pressure 100/67, pulse 65, temperature 97.5 F (36.4 C), temperature source Oral, resp. rate 20, height 5' (1.524 m), weight 81.8 kg (180 lb 5.4 oz), SpO2 99 %.  PHYSICAL EXAMINATION:   Physical Exam  GENERAL:  54 y.o.-year-old patient lying in the bed , looks critically ill. EYES: Pupils equal, round, reactive to light and accommodation. No scleral icterus. Extraocular muscles intact.  HEENT: Head atraumatic, normocephalic. Oropharynx and nasopharynx clear.  NECK:  Supple, no jugular venous distention. No thyroid enlargement, no tenderness.  LUNGS: Decreased breath sounds bilaterally. CARDIOVASCULAR : S1, S2 normal. No murmurs, rubs, or gallops.  ABDOMEN: Soft, nontender, nondistended. Bowel sounds present. No organomegaly or mass. Foley EXTREMITIES: No cyanosis, clubbing or edema b/l.    NEUROLOGIC: Cranial nerves II through XII are intact. No focal Motor or sensory deficits b/l.   PSYCHIATRIC: The patient is alert and awake. Flat affect SKIN: No obvious rash, lesion, or ulcer.    LABORATORY PANEL:    CBC  Recent Labs Lab 06/29/15 0458  WBC 9.8  HGB 10.0*  HCT 30.1*  PLT 315   ------------------------------------------------------------------------------------------------------------------  Chemistries   Recent Labs Lab 06/28/15 0506 06/29/15 0458  NA 138  --   K 3.9  --   CL 94*  --   CO2 33*  --   GLUCOSE 208*  --   BUN 28*  --   CREATININE 1.05* 1.18*  CALCIUM 8.6*  --    ------------------------------------------------------------------------------------------------------------------  Cardiac Enzymes  Recent Labs Lab 06/24/15 0637  TROPONINI <0.03   ------------------------------------------------------------------------------------------------------------------  RADIOLOGY:  Dg Chest 1 View  06/28/2015   CLINICAL DATA:  Acute respiratory failure, pneumonia, CHF  EXAM: CHEST  1 VIEW  COMPARISON:  Portable chest x-ray of June 24, 2015 and CT scan of the chest of June 25, 2015  FINDINGS: The lung volumes remain low. The hemidiaphragms remain obscured. The cardiac silhouette is mildly enlarged though stable. The pulmonary vascularity is normal. The trachea is midline. The bony thorax exhibits no acute abnormality.  IMPRESSION: Persistent bilateral hypoinflation with bibasilar atelectasis or pneumonia and small pleural effusions. There has not been significant interval change since the previous study.   Electronically Signed   By: David  Swaziland M.D.   On: 06/28/2015 07:26     ASSESSMENT AND PLAN:   78 f with DM, CHF, Schizoaffective disorder here from group home due to cough, hyperglycemia  * Bilateral pneumonia On IV Zosyn and vancomycin per Dr. Belia Heman. Scheduled nebulizers. Ct scan shows lot of mucous and raised concern regarding aspiration. Did  well with swallow eval. Started on diet.  * Acute respiratory failure, Hypoxic On HFNC 75% Due to  Pneumonia. On HFNC Critically ill. High risk for intubation. Bronchoscopy might help but will lead to  intubation and might be difficult extubation per pulmonary. Try conservative management.  * Acute on chronic diastolic chf - Diuresed I/Os. Now on oral torsemide.  * UTI Resolved  * DM, Uncontrolled Home meds. SSI, ADA.  * Seizures On depakote  * AOCD Stable  * Parkinsons? PCP working this up as OP  All the records are reviewed and case discussed with Care Management/Social Workerr. Management plans discussed with Sister(HCPOA) and in agreement.  CODE STATUS: FULL  DVT Prophylaxis: SCDs, Lovenox  TOTAL CRITICAL CARE TIME TAKING CARE OF THIS PATIENT: 38 minutes.   Milagros Loll R M.D on 06/23/2015 at 7:56 AM  Between 7am to 6pm - Pager - [phone]  After 6pm go to [email or web address] - password [email or web address] [facility]  [place] [facility]  Office  [phone]  CC: Primary care physician; Derwood Kaplan, MD

## 2015-06-29 NOTE — Clinical Social Work Note (Signed)
According to RN CM, patient has been accepted at [facility] and patient's sister is in agreement. Patient to transfer when MD decides to discharge. York Spaniel MSW,LCSW [phone]

## 2015-06-29 NOTE — Progress Notes (Signed)
Speech Language Pathology Treatment: Dysphagia  Patient Details Name: Candice Sawyer MRN: [id number] DOB: 09-25-1961 Today's Date: 06/29/2015 Time: 9811-9147 SLP Time Calculation (min) (ACUTE ONLY): 36 min  Assessment / Plan / Recommendation Clinical Impression  Pt appeared to tolerate trials of thin liquids, purees, and soft solids w/ no overt s/s of aspiration noted; no change in vocal quality post trials, no decline in respiratory status/change of breathing w/ trials or b/t trials, no decline in vital signs. Pt's oral phase of swallowing was grossly wfl; pt exhibited slightly impulsive drinking behavior as she drank but suspect this could be baseline behavior for pt sec. to her Cognitive status; sister has stated pt eats "quickly" and she felt pt "should slow down". Pt was assisted w/ drinking and eating d/t shaky UEs holding the cup and weakness for feeding self, and verbal cues were given to slow down and take small, single sips/bites to lessen risk for aspiration. Of note, sister stated pt's Primary MD (Dr. Maryellen Pile) had stated pt had "Parkinson's Dis." - this was relayed to her current MD who will f/u w/ this as this type of dx could impact swallowing. Consulted NSG who stated pt tolerated meds in puree and the breakfast meal appropriately. Rec. Continue w/ current Dys. 2 diet(for easier mastication effort to not increase exertion on the respiratory status); aspiration precautions and assistance at meals sec. to respiratory status and O2 needs at this time. Rec. meds in puree for easier swallowing currently. NSG agreed. Will f/u next 2-3 days   HPI Other Pertinent Information: Pt is a 54 y.o. female with a known history of CHF, seizures, CAD, diabetes mellitus, dementia, schizophrenia, bipolar dis., Obesity who presents from group home with complaints of weakness, pleuritic chest pain and cough. Some hyperglycemia. Recently started on new diabetes medication by PCP. She was been found to have  bibasilar pneumonia along with a urinary tract infection. Saturations on room air 85% at admission. Pt's O2 support was increased post admission and her O2 sats remain ~80%, per RT. Pt was transferred to CCU and placed on HFNC O2 support. Pt answers basic questions re: self w/ limited details and "yes/no" answers; suspect this could be related to her baseline Cognitive status. Pt has been tolerating the rec'd Dys. 2 diet w/ no overt s/s of aspiration noted w/ po's per NSG report.    Pertinent Vitals Pain Assessment: No/denies pain  SLP Plan  Continue with current plan of care    Recommendations Diet recommendations: Dysphagia 2 (fine chop);Thin liquid Liquids provided via: Cup;Straw Medication Administration: Whole meds with puree Supervision: Full supervision/cueing for compensatory strategies;Trained caregiver to feed patient Compensations: Slow rate;Small sips/bites Postural Changes and/or Swallow Maneuvers: Seated upright 90 degrees              General recommendations:  (Dietician) Oral Care Recommendations: Staff/trained caregiver to provide oral care;Oral care BID;Oral care before and after PO Follow up Recommendations: Skilled Nursing facility Plan: Continue with current plan of care    GO    Jerilynn Som, MS, CCC-SLP Watson,Katherine 06/29/2015, 3:06 PM

## 2015-06-29 NOTE — Progress Notes (Signed)
ANTIBIOTIC CONSULT NOTE - FOLLOW UP  Pharmacy Consult for Vancomycin and Zosyn  Indication: pneumonia  Allergies  Allergen Reactions  . Haloperidol And Related Anaphylaxis  . Stelazine [Trifluoperazine] Anaphylaxis  . Bupropion Other (See Comments)    Reaction:  Unknown   . Losartan Potassium Other (See Comments)    Reaction:  Unknown   . Thorazine [Chlorpromazine] Other (See Comments)    Reaction:  Unknown   . Trazodone And Nefazodone Other (See Comments)    Reaction:  Unknown     Patient Measurements: Height: 5' (152.4 cm) Weight: 180 lb 5.4 oz (81.8 kg) IBW/kg (Calculated) : 45.5  Vital Signs: Temp: 97.5 F (36.4 C) (07/22 0745) Temp Source: Oral (07/22 0745) BP: 79/55 mmHg (07/22 1000) Pulse Rate: 86 (07/22 1000) Intake/Output from previous day: 07/21 0701 - 07/22 0700 In: 1180 [P.O.:830; IV Piggyback:350] Out: 1500 [Urine:1500] Intake/Output from this shift: Total I/O In: -  Out: 300 [Urine:300]  Labs:  Recent Labs  06/27/15 0434 06/28/15 0506 06/29/15 0458  WBC 9.6 10.5 9.8  HGB 9.6* 10.0* 10.0*  PLT 283 295 315  CREATININE 1.25* 1.05* 1.18*   Estimated Creatinine Clearance: 51.6 mL/min (by C-G formula based on Cr of 1.18).  Recent Labs  06/28/15 1732  VANCOTROUGH 21*     Microbiology: Recent Results (from the past 720 hour(s))  Urine culture     Status: None   Collection Time: 06/22/15 10:07 AM  Result Value Ref Range Status   Specimen Description URINE, RANDOM  Final   Special Requests NONE  Final   Culture   Final    MULTIPLE SPECIES PRESENT, SUGGEST RECOLLECTION IF CLINICALLY INDICATED   Report Status 06/23/2015 FINAL  Final  Culture, sputum-assessment     Status: None (Preliminary result)   Collection Time: 06/23/15  8:20 PM  Result Value Ref Range Status   Specimen Description EXPECTORATED SPUTUM  Final   Special Requests NONE  Final   Sputum evaluation   Final    Sputum specimen not acceptable for testing.  Please recollect.    Madelaine Bhat WARD AT 2102 06/23/15 FOR RECOLLECT.PMH   Report Status PENDING  Incomplete  MRSA PCR Screening     Status: None   Collection Time: 06/26/15 10:18 AM  Result Value Ref Range Status   MRSA by PCR NEGATIVE NEGATIVE Final    Comment:        The GeneXpert MRSA Assay (FDA approved for NASAL specimens only), is one component of a comprehensive MRSA colonization surveillance program. It is not intended to diagnose MRSA infection nor to guide or monitor treatment for MRSA infections.     Anti-infectives    Start     Dose/Rate Route Frequency Ordered Stop   06/28/15 2000  vancomycin (VANCOCIN) IVPB 1000 mg/200 mL premix     1,000 mg 200 mL/hr over 60 Minutes Intravenous Every 18 hours 06/28/15 1816     06/27/15 0600  vancomycin (VANCOCIN) 1,250 mg in sodium chloride 0.9 % 250 mL IVPB  Status:  Discontinued     1,250 mg 166.7 mL/hr over 90 Minutes Intravenous Every 18 hours 06/26/15 1159 06/28/15 1816   06/26/15 1400  piperacillin-tazobactam (ZOSYN) IVPB 3.375 g     3.375 g 12.5 mL/hr over 240 Minutes Intravenous 3 times per day 06/26/15 1159     06/26/15 1200  vancomycin (VANCOCIN) 1,500 mg in sodium chloride 0.9 % 500 mL IVPB     1,500 mg 250 mL/hr over 120 Minutes Intravenous  Once 06/26/15  1159 06/26/15 1601   06/25/15 1400  levofloxacin (LEVAQUIN) IVPB 750 mg  Status:  Discontinued     750 mg 100 mL/hr over 90 Minutes Intravenous Every 24 hours 06/25/15 1305 06/26/15 1152   06/22/15 1315  levofloxacin (LEVAQUIN) IVPB 750 mg     750 mg 100 mL/hr over 90 Minutes Intravenous  Once 06/22/15 1309 06/22/15 1656      Assessment: 54 yof with acute respiratory failure transferred to ICU for progressive hypoxia, broadening antibiotics to include HCAP organisms.   Vancomycin trough is slightly above goal, however not at steady state.   Goal of Therapy:  Vancomycin trough level 15-20 mcg/ml  Plan:  Vancomycin: will continue vancomycin to 1000 mg iv q 18 hours  and check a trough with the 4th dose.    Zosyn: Will continue zosyn 3.375 g iv q8 hours.  Imajean Mcdermid D 06/29/2015,12:25 PM

## 2015-06-29 NOTE — Assessment & Plan Note (Signed)
--  Continue both long acting and sliding scale coverage.

## 2015-06-29 NOTE — Care Management Important Message (Signed)
Important Message  Patient Details  Name: Candice Sawyer MRN: [id number] Date of Birth: [birth year]   Medicare Important Message Given:  Yes-fourth notification given    Verita Schneiders Allmond 06/29/2015, 9:53 AM

## 2015-06-29 NOTE — Consult Note (Signed)
CRITICAL CARE MEDICINE: ICU PROGRESS NOTE   ASSESSMENT / PLAN: Acute respiratory failure with hypoxia --Secondary to pneumonia.  --Very slow but progressive improvement. Hi-flow oxygen has been weaned down to 60%.  --Pt appears comfortable and does not appear to be in respiratory distress.  --Will attempt to advance activity today, will consult PT, and have patient sit up in chair to improve lung mechanics.  --OK to transfer to [facility], current in process.   Hyperglycemia --Continue both long acting and sliding scale coverage.   Pneumonia --Likely health care associated pneumonia, continue empiric coverage with broad spectrum.   Atelectasis --Has reduced lung volumes likely made worse by obesity.  --Likely contributing to pneumonia and slow recovery.  --Will increase movement, sit up in chair, consult PT.   Schizophrenia --  Obesity --       Name: Candice Sawyer MRN: [id number] DOB: 10-01-1961    ADMISSION DATE:  06/22/2015    CHIEF COMPLAINT:  dyspnea   STUDIES:  CXR image and report reviewed, continue low lung volumes.    SUBJECTIVE:   Pt currently on the ventilator, can not provide history or review of systems.   Review of Systems:  Constitutional: Feels well. Cardiovascular: No chest pain.  Pulmonary: Denies dyspnea.   The remainder of systems were reviewed and were found to be negative other than what is documented in the HPI.    VITAL SIGNS: Temp:  [97.5 F (36.4 C)-98.2 F (36.8 C)] 97.5 F (36.4 C) (07/22 0745) Pulse Rate:  [63-87] 86 (07/22 1000) Resp:  [15-29] 24 (07/22 1000) BP: (79-181)/(55-117) 79/55 mmHg (07/22 1000) SpO2:  [93 %-100 %] 94 % (07/22 1136) FiO2 (%):  [60 %-99 %] 60 % (07/22 1136) Weight:  [81.8 kg (180 lb 5.4 oz)] 81.8 kg (180 lb 5.4 oz) (07/22 0503) HEMODYNAMICS:   VENTILATOR SETTINGS: Vent Mode:  [-]  FiO2 (%):  [60 %-99 %] 60 % INTAKE / OUTPUT:  Intake/Output Summary (Last 24 hours) at 06/29/15 1203 Last data  filed at 06/29/15 0900  Gross per 24 hour  Intake    900 ml  Output   1150 ml  Net   -250 ml    PHYSICAL EXAMINATION: Physical Examination:   VS: BP 79/55 mmHg  Pulse 86  Temp(Src) 97.5 F (36.4 C) (Oral)  Resp 24  Ht 5' (1.524 m)  Wt 81.8 kg (180 lb 5.4 oz)  BMI 35.22 kg/m2  SpO2 94%  LMP  (LMP Unknown)  General Appearance: No distress  Neuro:without focal findings, mental status normal. HEENT: PERRLA, EOM intact. Pulmonary: normal breath sounds   CardiovascularNormal S1,S2.  No m/r/g.   Abdomen: Benign, Soft, non-tender. Renal:  No costovertebral tenderness  GU:  Not performed at this time. Endocrine: No evident thyromegaly. Skin:   warm, no rashes, no ecchymosis  Extremities: normal, no cyanosis, clubbing.   LABS:  CBC  Recent Labs Lab 06/27/15 0434 06/28/15 0506 06/29/15 0458  WBC 9.6 10.5 9.8  HGB 9.6* 10.0* 10.0*  HCT 29.1* 30.4* 30.1*  PLT 283 295 315   Coag's No results for input(s): APTT, INR in the last 168 hours. BMET  Recent Labs Lab 06/26/15 0448 06/27/15 0434 06/28/15 0506 06/29/15 0458  NA 139 143 138  --   K 4.2 4.0 3.9  --   CL 91* 94* 94*  --   CO2 34* 34* 33*  --   BUN 28* 33* 28*  --   CREATININE 1.16* 1.25* 1.05* 1.18*  GLUCOSE 201* 149* 208*  --  Electrolytes  Recent Labs Lab 06/26/15 0448 06/27/15 0434 06/28/15 0506  CALCIUM 8.4* 8.6* 8.6*   Sepsis Markers No results for input(s): LATICACIDVEN, PROCALCITON, O2SATVEN in the last 168 hours. ABG  Recent Labs Lab 06/26/15 0900  PHART 7.50*  PCO2ART 48  PO2ART 62*   Liver Enzymes No results for input(s): AST, ALT, ALKPHOS, BILITOT, ALBUMIN in the last 168 hours. Cardiac Enzymes  Recent Labs Lab 06/24/15 0637  TROPONINI <0.03   Glucose  Recent Labs Lab 06/28/15 1545 06/28/15 1823 06/28/15 2059 06/28/15 2351 06/29/15 0355 06/29/15 0753  GLUCAP 513* 472* 431* 332* 231* 146*    Imaging No results found.   Wells Guiles, MD [place]  Pulmonary and Critical Care Pager [phone]

## 2015-06-29 NOTE — Plan of Care (Signed)
Problem: SLP Dysphagia Goals Goal: Misc Dysphagia Goal Pt will safely tolerate po diet of least restrictive consistency w/ no overt s/s of aspiration noted by Staff/pt/family x3 sessions.    

## 2015-06-29 NOTE — Discharge Summary (Signed)
[facility] - [place] at [facility]   PATIENT NAME: Candice Sawyer    MR#:  [id number]  DATE OF BIRTH:  09-25-1961  DATE OF ADMISSION:  06/22/2015 ADMITTING PHYSICIAN: Milagros Loll, MD  DATE OF DISCHARGE: No discharge date for patient encounter.  PRIMARY CARE PHYSICIAN: Derwood Kaplan, MD    ADMISSION DIAGNOSIS:  Community acquired pneumonia [J18.9] Acute respiratory failure with hypoxia [J96.01]  DISCHARGE DIAGNOSIS:  Active Problems:   Pneumonia   Acute respiratory failure with hypoxia   Hyperglycemia   SECONDARY DIAGNOSIS:   Past Medical History  Diagnosis Date  . Seizures   . Diabetes mellitus without complication   . CHF (congestive heart failure)   . Bipolar 1 disorder   . Schizophrenia   . Constipation   . CAD (coronary artery disease)   . HTN (hypertension)   . Obesity   . Dementia      ADMITTING HISTORY  Candice Sawyer is a 54 y.o. female with a known history of CHF, diabetes mellitus, dementia, schizophrenia presents from group home with complaints of weakness, pleuritic chest pain and cough. Some hyperglycemia. Recently started on new diabetes medication by PCP. Patient has schizophrenia and bipolar disorder. Overall poor historian. Does complain of some dry cough along with right central chest pain. Has chronic lower extremity edema. He is unable to tell me when her symptoms started or the progression of disease. Here she has been found to have bibasilar pneumonia along with a urinary tract infection. Saturations on room air 85%. Included his oxygen.   HOSPITAL COURSE:   82 f with DM, CHF, Schizoaffective disorder here from group home due to cough, hyperglycemia  * Bilateral pneumonia On IV Zosyn and vancomycin per Dr. Belia Heman. Scheduled nebulizers. Ct scan shows lot of mucous and raised concern regarding aspiration. Did well with swallow eval. Started on diet.  * Acute respiratory failure, Hypoxic On HFNC 75% Due to  Pneumonia. On HFNC Critically ill. High risk for intubation. Bronchoscopy might help but will lead to intubation and might be difficult extubation per pulmonary. Try conservative management.  * Acute on chronic diastolic chf - Diuresed I/Os. Now on oral torsemide.  * UTI Resolved  * DM, Uncontrolled Home meds. SSI, ADA.  * Seizures On depakote  * AOCD Stable  * Parkinsons? PCP working this up as Dispensing optician to [employer] as patient will likely need many days to wean O2.  CONSULTS OBTAINED:  Treatment Team:  Erin Fulling, MD Shane Crutch, MD  DRUG ALLERGIES:   Allergies  Allergen Reactions  . Haloperidol And Related Anaphylaxis  . Stelazine [Trifluoperazine] Anaphylaxis  . Bupropion Other (See Comments)    Reaction:  Unknown   . Losartan Potassium Other (See Comments)    Reaction:  Unknown   . Thorazine [Chlorpromazine] Other (See Comments)    Reaction:  Unknown   . Trazodone And Nefazodone Other (See Comments)    Reaction:  Unknown     DISCHARGE MEDICATIONS:   Current Discharge Medication List    CONTINUE these medications which have NOT CHANGED   Details  acetaminophen (TYLENOL) 500 MG tablet Take 500 mg by mouth every 4 (four) hours as needed for mild pain.    aspirin EC 81 MG tablet Take 81 mg by mouth daily.    atorvastatin (LIPITOR) 40 MG tablet Take 40 mg by mouth at bedtime.    cloZAPine (CLOZARIL) 100 MG tablet Take 50-450 mg by mouth 2 (two) times daily. Pt takes 50mg   at 5pm and 450mg  at bedtime.    !! divalproex (DEPAKOTE) 500 MG DR tablet Take 500 mg by mouth 3 (three) times daily.    donepezil (ARICEPT) 23 MG TABS tablet Take 23 mg by mouth at bedtime.    guaiFENesin (ROBITUSSIN) 100 MG/5ML liquid Take 200 mg by mouth 3 (three) times daily as needed for cough.    Linaclotide (LINZESS) 290 MCG CAPS capsule Take 290 mcg by mouth 3 (three) times a week. Pt uses on Monday, Wednesday, and Friday.    linagliptin (TRADJENTA) 5 MG TABS  tablet Take 5 mg by mouth daily.    lubiprostone (AMITIZA) 24 MCG capsule Take 24 mcg by mouth daily.    magnesium hydroxide (MILK OF MAGNESIA) 400 MG/5ML suspension Take 15 mLs by mouth daily as needed for heartburn or indigestion.     metFORMIN (GLUCOPHAGE) 1000 MG tablet Take 1,000 mg by mouth 2 (two) times daily.     omeprazole (PRILOSEC) 20 MG capsule Take 20 mg by mouth 2 (two) times daily.    PARoxetine (PAXIL) 40 MG tablet Take 40 mg by mouth daily.    potassium chloride SA (K-DUR,KLOR-CON) 20 MEQ tablet Take 20 mEq by mouth daily.    senna-docusate (SENOKOT-S) 8.6-50 MG per tablet Take 1 tablet by mouth 2 (two) times daily as needed for mild constipation.     torsemide (DEMADEX) 100 MG tablet Take 100 mg by mouth daily.     !! divalproex (DEPAKOTE) 250 MG DR tablet Take 2 tablets (500 mg total) by mouth 3 (three) times daily. Qty: 21 tablet, Refills: 0     !! - Potential duplicate medications found. Please discuss with provider.       Today    VITAL SIGNS:  Blood pressure 79/55, pulse 86, temperature 97.5 F (36.4 C), temperature source Oral, resp. rate 24, height 5' (1.524 m), weight 81.8 kg (180 lb 5.4 oz), SpO2 93 %.  I/O:   Intake/Output Summary (Last 24 hours) at 06/29/15 1112 Last data filed at 06/29/15 0900  Gross per 24 hour  Intake    900 ml  Output   1150 ml  Net   -250 ml    PHYSICAL EXAMINATION:  Physical Exam  GENERAL:  54 y.o.-year-old patient lying in the bed with no acute distress.  LUNGS: Decreased breath sounds bilateral CARDIOVASCULAR: S1, S2 normal. No murmurs, rubs, or gallops.  ABDOMEN: Soft, non-tender, non-distended. Bowel sounds present. No organomegaly or mass.  NEUROLOGIC: Moves all 4 extremities. PSYCHIATRIC: The patient is alert with flat effect SKIN: No obvious rash, lesion, or ulcer.   DATA REVIEW:   CBC  Recent Labs Lab 06/29/15 0458  WBC 9.8  HGB 10.0*  HCT 30.1*  PLT 315    Chemistries   Recent Labs Lab  06/28/15 0506 06/29/15 0458  NA 138  --   K 3.9  --   CL 94*  --   CO2 33*  --   GLUCOSE 208*  --   BUN 28*  --   CREATININE 1.05* 1.18*  CALCIUM 8.6*  --     Cardiac Enzymes  Recent Labs Lab 06/24/15 0637  TROPONINI <0.03    Microbiology Results  Results for orders placed or performed during the hospital encounter of 06/22/15  Urine culture     Status: None   Collection Time: 06/22/15 10:07 AM  Result Value Ref Range Status   Specimen Description URINE, RANDOM  Final   Special Requests NONE  Final   Culture  Final    MULTIPLE SPECIES PRESENT, SUGGEST RECOLLECTION IF CLINICALLY INDICATED   Report Status 06/23/2015 FINAL  Final  Culture, sputum-assessment     Status: None (Preliminary result)   Collection Time: 06/23/15  8:20 PM  Result Value Ref Range Status   Specimen Description EXPECTORATED SPUTUM  Final   Special Requests NONE  Final   Sputum evaluation   Final    Sputum specimen not acceptable for testing.  Please recollect.   Madelaine Bhat WARD AT 2102 06/23/15 FOR RECOLLECT.PMH   Report Status PENDING  Incomplete  MRSA PCR Screening     Status: None   Collection Time: 06/26/15 10:18 AM  Result Value Ref Range Status   MRSA by PCR NEGATIVE NEGATIVE Final    Comment:        The GeneXpert MRSA Assay (FDA approved for NASAL specimens only), is one component of a comprehensive MRSA colonization surveillance program. It is not intended to diagnose MRSA infection nor to guide or monitor treatment for MRSA infections.     RADIOLOGY:  Dg Chest 1 View  06/28/2015   CLINICAL DATA:  Acute respiratory failure, pneumonia, CHF  EXAM: CHEST  1 VIEW  COMPARISON:  Portable chest x-ray of June 24, 2015 and CT scan of the chest of June 25, 2015  FINDINGS: The lung volumes remain low. The hemidiaphragms remain obscured. The cardiac silhouette is mildly enlarged though stable. The pulmonary vascularity is normal. The trachea is midline. The bony thorax exhibits no  acute abnormality.  IMPRESSION: Persistent bilateral hypoinflation with bibasilar atelectasis or pneumonia and small pleural effusions. There has not been significant interval change since the previous study.   Electronically Signed   By: David  [place] M.D.   On: 06/28/2015 07:26      Follow up with PCP in 1 week.  Management plans discussed with the patient, family and they are in agreement.  CODE STATUS:     Code Status Orders        Start     Ordered   06/22/15 1329  Full code   Continuous     06/22/15 1330      TOTAL TIME TAKING CARE OF THIS PATIENT ON DAY OF DISCHARGE: more than 30 minutes.    Milagros Loll R M.D on 06/29/2015 at 11:12 AM  Between 7am to 6pm - Pager - [phone]  After 6pm go to [email or web address] - password [email or web address] [facility]  [facility]  Office  [phone]  CC: Primary care physician; Derwood Kaplan, MD

## 2015-07-05 LAB — EXPECTORATED SPUTUM ASSESSMENT W REFEX TO RESP CULTURE

## 2015-07-05 LAB — EXPECTORATED SPUTUM ASSESSMENT W GRAM STAIN, RFLX TO RESP C

## 2015-07-31 NOTE — Telephone Encounter (Signed)
This encounter was created in error - please disregard.

## 2015-08-09 DEATH — deceased

## 2016-07-15 IMAGING — CT CT CHEST W/O CM
1 of 2 series · 14 of 31 positions shown, 18 images · non-contrast
Comparison: No priors.

CLINICAL DATA: 54-year-old female with recent hospital admission
for cough. Shortness of breath and weakness for 1 week. No
improvement of symptoms while hospitalized.

EXAM:
CT CHEST WITHOUT CONTRAST
TECHNIQUE: Multidetector CT imaging of the chest was performed following the
standard protocol without IV contrast.

[Series 3: lung · axial · 0.59mm/px · z∈[-368,-204]mm · 14 of 41 slices shown, 18 images]
[im 4/41  mediastinal]
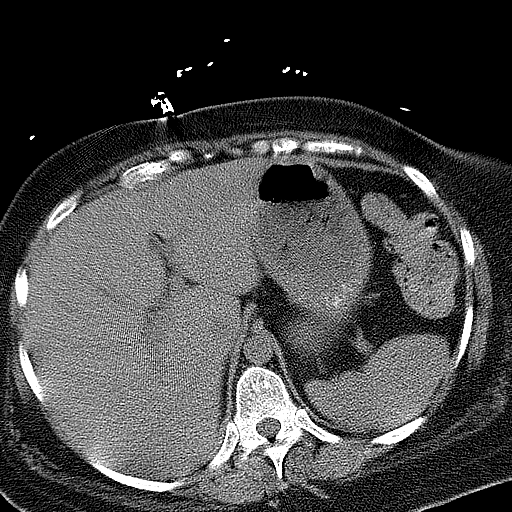
[im 4/41  lung]
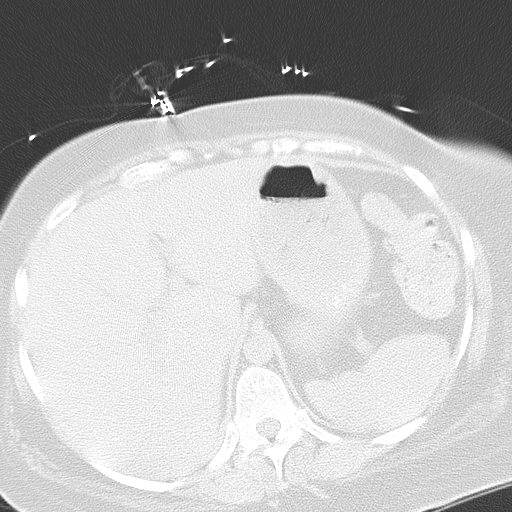
[im 7/41  lung]
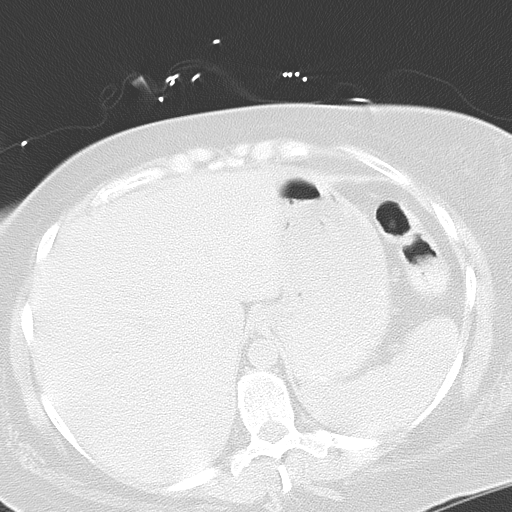
[im 10/41  lung]
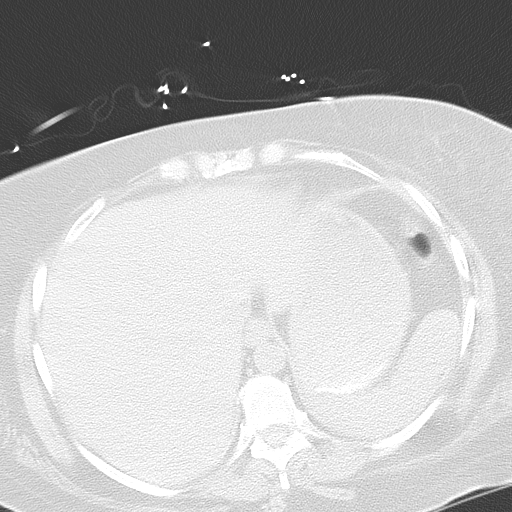
[im 13/41  lung]
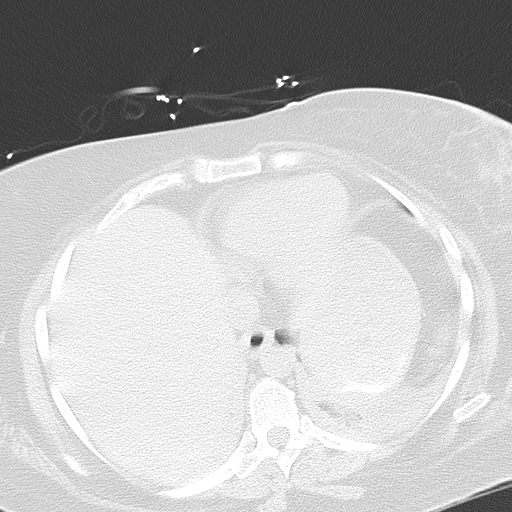
[im 16/41  mediastinal]
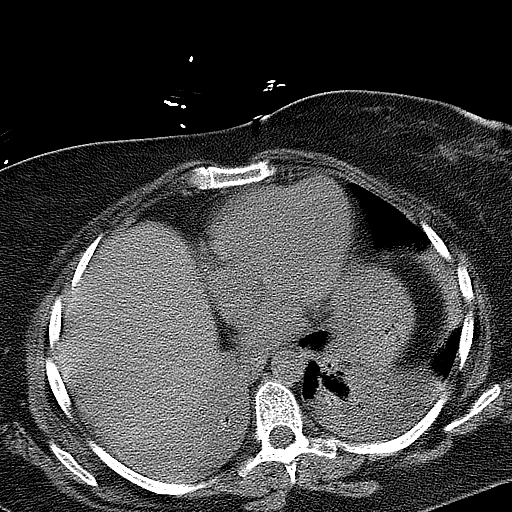
[im 16/41  lung]
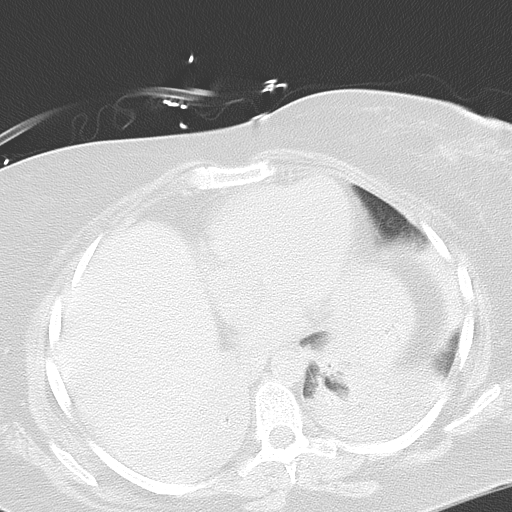
[im 18/41  lung]
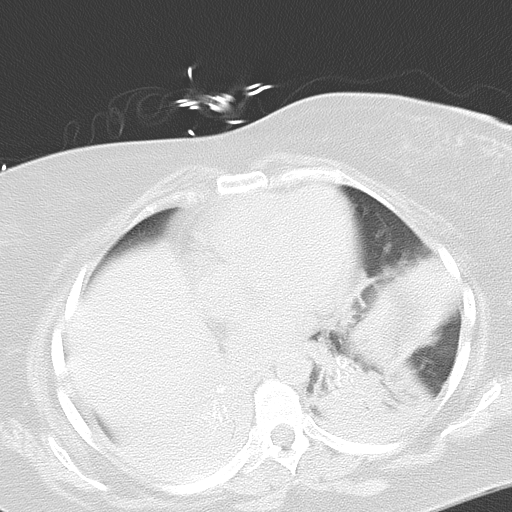
[im 19/41  lung]
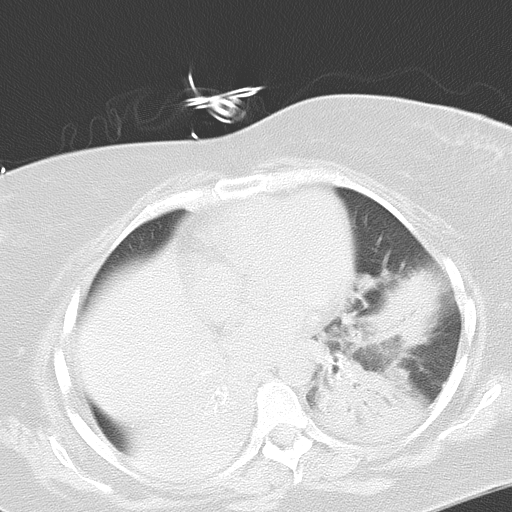
[im 21/41  lung]
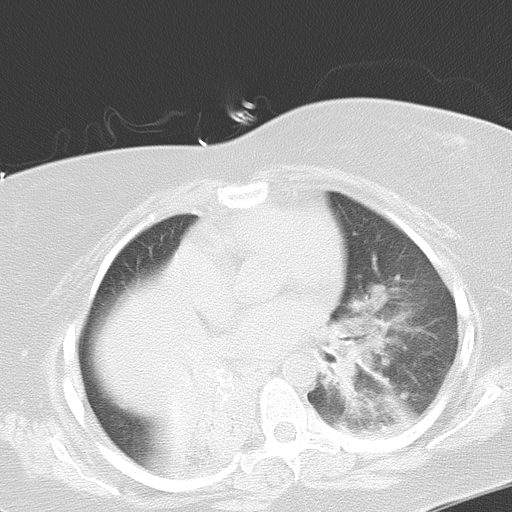
[im 22/41  mediastinal]
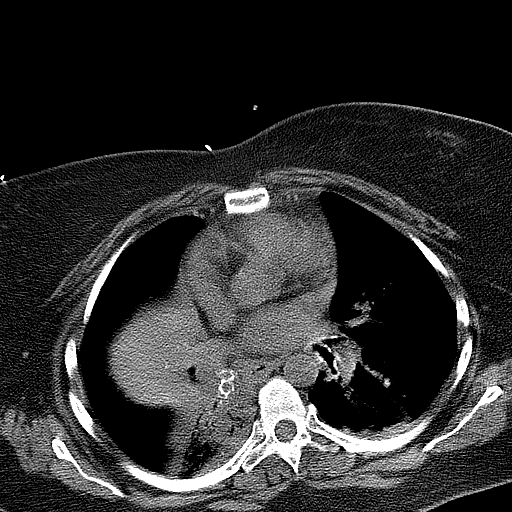
[im 22/41  lung]
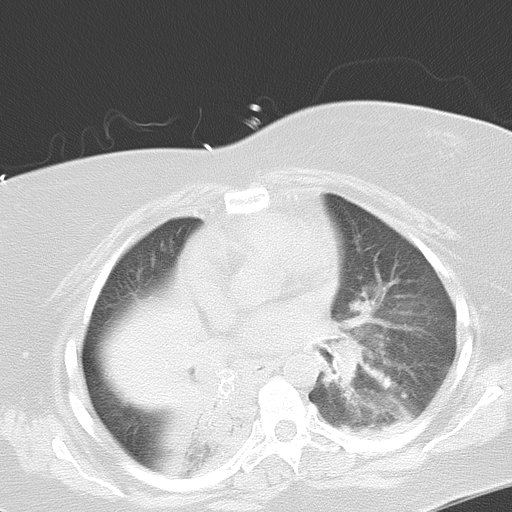
[im 25/41  lung]
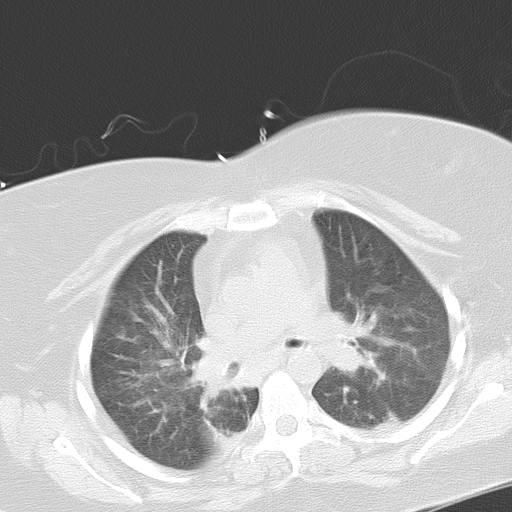
[im 28/41  lung]
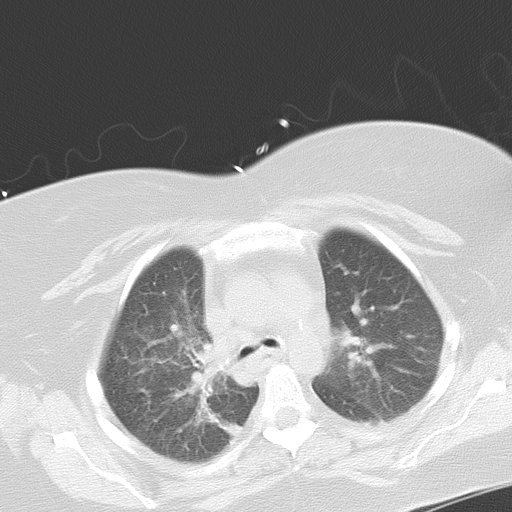
[im 31/41  lung]
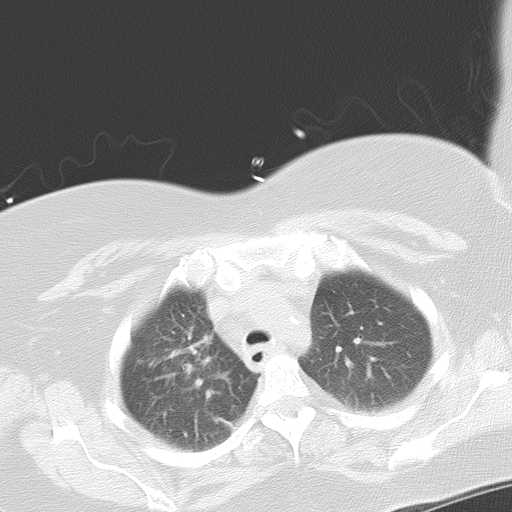
[im 34/41  mediastinal]
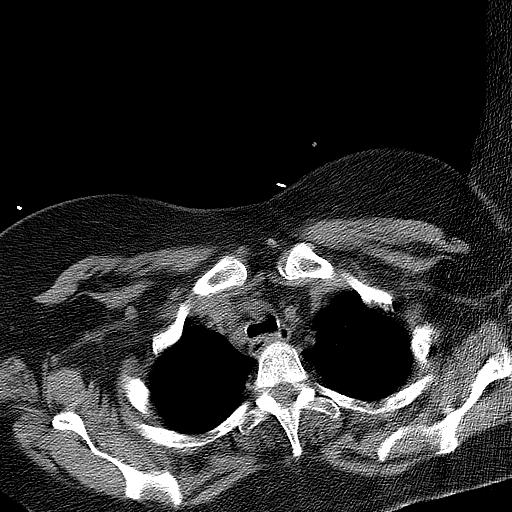
[im 34/41  lung]
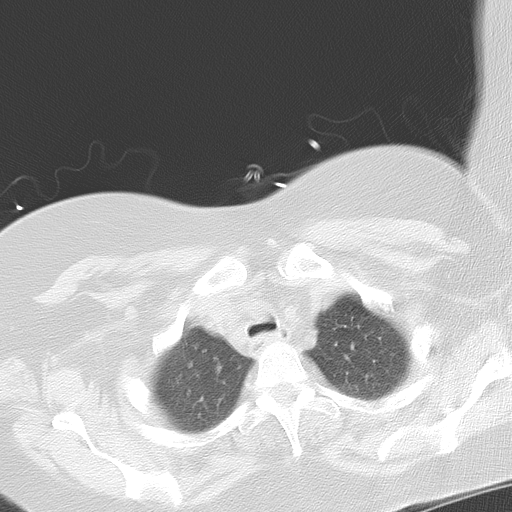
[im 37/41  lung]
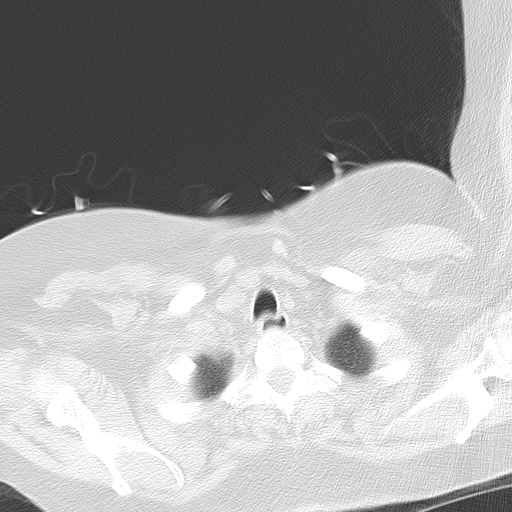

[14 of 31 positions shown; findings below may reference images not displayed]

FINDINGS: Mediastinum/Lymph Nodes: Heart size is normal. There is no
significant pericardial fluid, thickening or pericardial
calcification. Multiple prominent borderline enlarged mediastinal
lymph nodes are nonspecific, favored to be reactive. No
pathologically enlarged mediastinal or hilar lymph nodes. Please
note that accurate exclusion of hilar adenopathy is limited on
noncontrast CT scans. Esophagus is unremarkable in appearance. No
axillary lymphadenopathy.

Lungs/Pleura: There appears to be a combination of airspace
consolidation and volume loss in the lower lobes of the lungs
bilaterally, as well as the right middle lobe. Several fluid filled
bronchi are noted, particularly on the right side where the bronchus
intermedius, right middle lobe and right lower lobe bronchi are all
opacified. No pleural effusions. No definite suspicious appearing
pulmonary nodules or masses.

Upper Abdomen: Unremarkable.

Musculoskeletal/Soft Tissues: There are no aggressive appearing
lytic or blastic lesions noted in the visualized portions of the
skeleton.
IMPRESSION: 1. There is a combination of airspace consolidation and atelectasis
in the lower lobes of the lungs bilaterally and the right middle
lobe, with large amount of fluid in the right-sided bronchi, as
discussed above. Findings are favored to reflect an aspiration
pneumonia.

## 2016-07-18 IMAGING — CR DG CHEST 1V
1 series · 1 of 1 positions shown · non-contrast
Comparison: Portable chest x-ray June 24, 2015 and CT scan of
the chest of June 25, 2015

CLINICAL DATA: Acute respiratory failure, pneumonia, CHF

EXAM:
CHEST  1 VIEW

[portable]
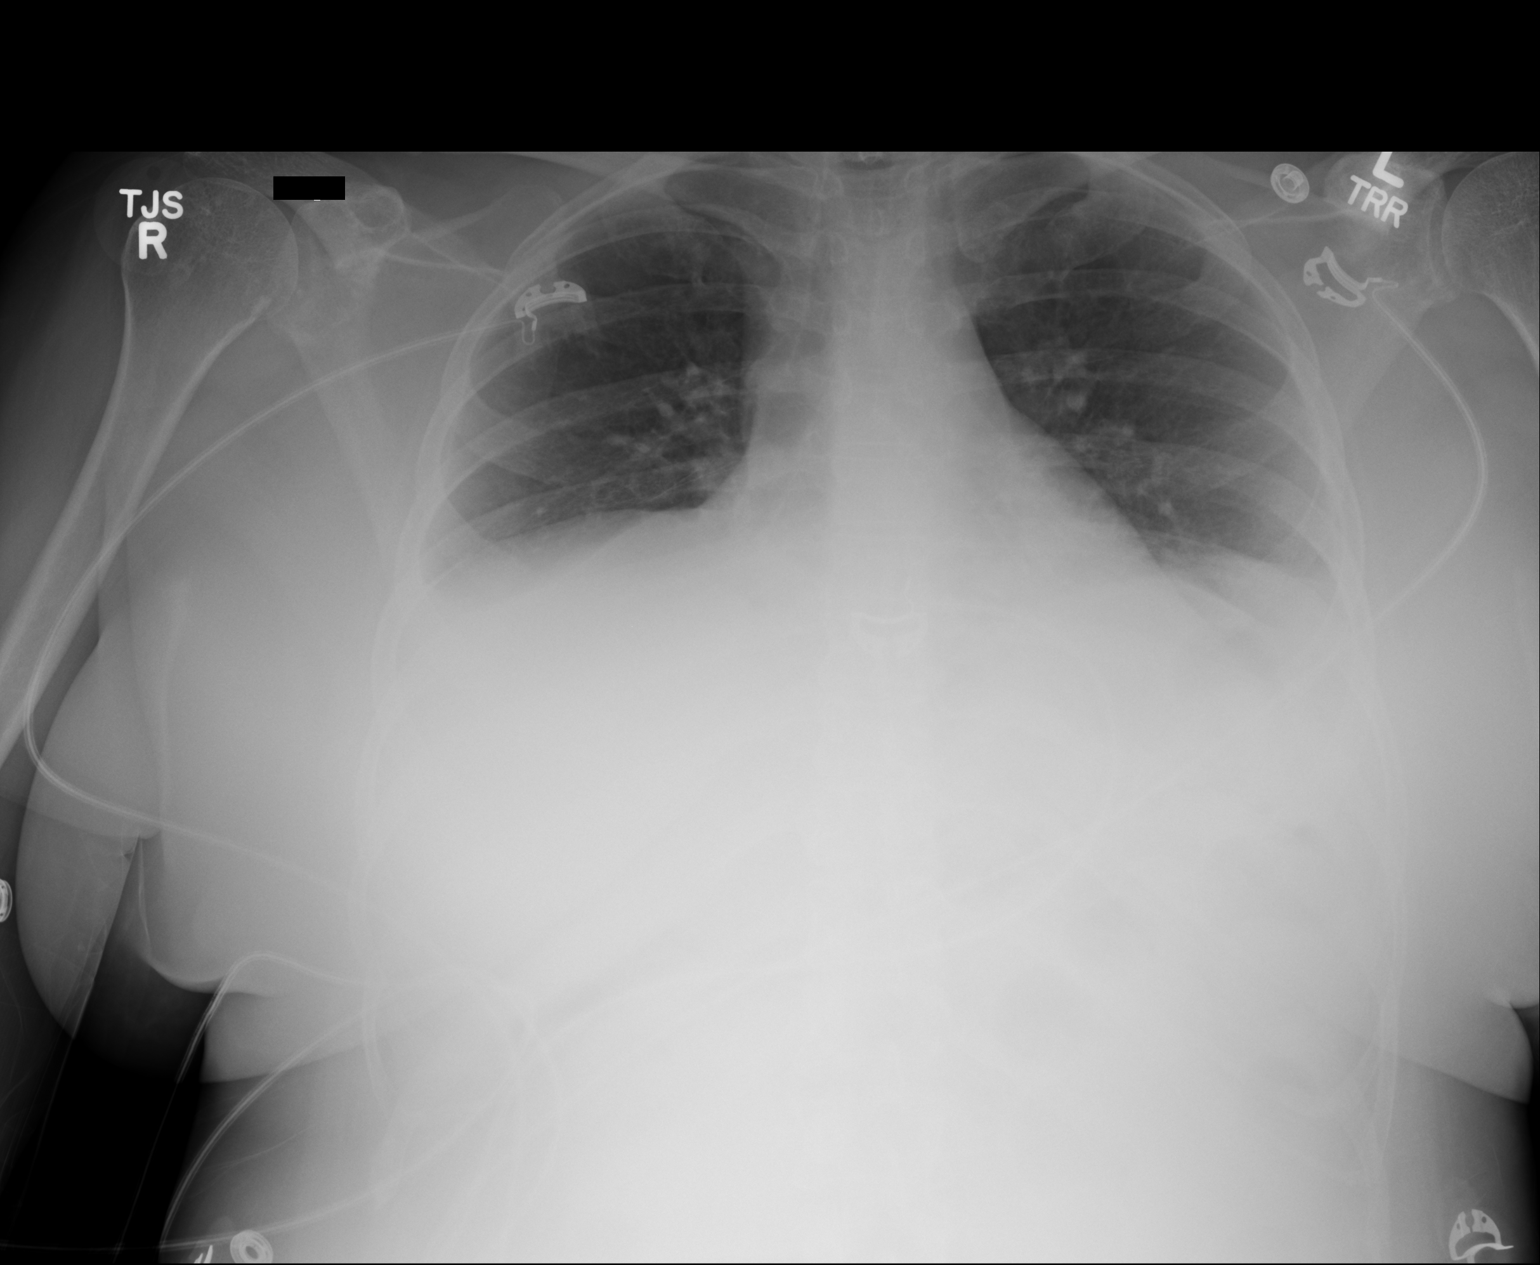

[1 of 1 positions shown; findings below may reference images not displayed]

FINDINGS: The lung volumes remain low. The hemidiaphragms remain obscured. The
cardiac silhouette is mildly enlarged though stable. The pulmonary
vascularity is normal. The trachea is midline. The bony thorax
exhibits no acute abnormality.
IMPRESSION: Persistent bilateral hypoinflation with bibasilar atelectasis or
pneumonia and small pleural effusions. There has not been
significant interval change since the previous study.

## 2016-08-08 DEATH — deceased
